# Patient Record
Sex: Female | Born: 1990 | Race: Black or African American | Marital: Married | Smoking: Never smoker
Health system: Southern US, Community
[De-identification: ages and names within clinical notes are randomized; demographics above are authoritative.]

## PROBLEM LIST (undated history)

## (undated) DIAGNOSIS — D249 Benign neoplasm of unspecified breast: Secondary | ICD-10-CM

## (undated) HISTORY — PX: BREAST SURGERY: SHX581

## (undated) HISTORY — DX: Benign neoplasm of unspecified breast: D24.9

---

## 2011-09-02 DIAGNOSIS — D249 Benign neoplasm of unspecified breast: Secondary | ICD-10-CM | POA: Insufficient documentation

## 2014-02-02 DIAGNOSIS — O1490 Unspecified pre-eclampsia, unspecified trimester: Secondary | ICD-10-CM

## 2014-02-02 HISTORY — DX: Unspecified pre-eclampsia, unspecified trimester: O14.90

## 2014-04-25 DIAGNOSIS — Z9289 Personal history of other medical treatment: Secondary | ICD-10-CM | POA: Insufficient documentation

## 2016-07-24 DIAGNOSIS — L700 Acne vulgaris: Secondary | ICD-10-CM | POA: Diagnosis not present

## 2016-08-31 DIAGNOSIS — N760 Acute vaginitis: Secondary | ICD-10-CM | POA: Diagnosis not present

## 2016-09-09 DIAGNOSIS — L700 Acne vulgaris: Secondary | ICD-10-CM | POA: Diagnosis not present

## 2016-09-18 DIAGNOSIS — R399 Unspecified symptoms and signs involving the genitourinary system: Secondary | ICD-10-CM | POA: Diagnosis not present

## 2016-09-18 DIAGNOSIS — N898 Other specified noninflammatory disorders of vagina: Secondary | ICD-10-CM | POA: Diagnosis not present

## 2016-09-18 DIAGNOSIS — R0789 Other chest pain: Secondary | ICD-10-CM | POA: Diagnosis not present

## 2016-10-05 DIAGNOSIS — R079 Chest pain, unspecified: Secondary | ICD-10-CM | POA: Diagnosis not present

## 2016-10-05 DIAGNOSIS — R51 Headache: Secondary | ICD-10-CM | POA: Diagnosis not present

## 2016-10-12 DIAGNOSIS — L700 Acne vulgaris: Secondary | ICD-10-CM | POA: Diagnosis not present

## 2016-10-12 DIAGNOSIS — L300 Nummular dermatitis: Secondary | ICD-10-CM | POA: Diagnosis not present

## 2016-10-12 DIAGNOSIS — L858 Other specified epidermal thickening: Secondary | ICD-10-CM | POA: Diagnosis not present

## 2016-11-27 DIAGNOSIS — R87610 Atypical squamous cells of undetermined significance on cytologic smear of cervix (ASC-US): Secondary | ICD-10-CM | POA: Diagnosis not present

## 2016-11-27 DIAGNOSIS — M778 Other enthesopathies, not elsewhere classified: Secondary | ICD-10-CM | POA: Diagnosis not present

## 2016-11-27 DIAGNOSIS — Z124 Encounter for screening for malignant neoplasm of cervix: Secondary | ICD-10-CM | POA: Diagnosis not present

## 2016-11-27 DIAGNOSIS — Z0000 Encounter for general adult medical examination without abnormal findings: Secondary | ICD-10-CM | POA: Diagnosis not present

## 2017-01-26 DIAGNOSIS — J069 Acute upper respiratory infection, unspecified: Secondary | ICD-10-CM | POA: Diagnosis not present

## 2017-01-26 DIAGNOSIS — R109 Unspecified abdominal pain: Secondary | ICD-10-CM | POA: Diagnosis not present

## 2017-01-26 DIAGNOSIS — R1031 Right lower quadrant pain: Secondary | ICD-10-CM | POA: Diagnosis not present

## 2017-03-22 DIAGNOSIS — G4489 Other headache syndrome: Secondary | ICD-10-CM | POA: Diagnosis not present

## 2017-03-30 ENCOUNTER — Ambulatory Visit: Payer: Self-pay | Admitting: Family Medicine

## 2017-03-30 VITALS — BP 128/83 | HR 97 | Temp 98.2°F | Wt 124.0 lb

## 2017-03-30 DIAGNOSIS — R059 Cough, unspecified: Secondary | ICD-10-CM

## 2017-03-30 DIAGNOSIS — R52 Pain, unspecified: Secondary | ICD-10-CM

## 2017-03-30 DIAGNOSIS — R69 Illness, unspecified: Secondary | ICD-10-CM

## 2017-03-30 DIAGNOSIS — R05 Cough: Secondary | ICD-10-CM

## 2017-03-30 DIAGNOSIS — J111 Influenza due to unidentified influenza virus with other respiratory manifestations: Secondary | ICD-10-CM

## 2017-03-30 LAB — POCT INFLUENZA A/B
INFLUENZA B, POC: NEGATIVE
Influenza A, POC: NEGATIVE

## 2017-03-30 NOTE — Progress Notes (Signed)
Laurie Rogers is a 27 y/o female who presents with 6 days of flu like symptoms. She reports positive exposure to her daughter and SO and has been using OTC treatment of tylenol cold and flu with minimal relief. Major symptoms are body aches, cough and sore throat. She denies a history of major chronic medical conditions or asthma.   Review of Systems  Constitutional: Positive for chills, fever and malaise/fatigue. Negative for diaphoresis and weight loss.  HENT: Positive for congestion and sore throat. Negative for ear discharge and sinus pain.   Eyes: Negative.   Respiratory: Positive for cough and sputum production. Negative for shortness of breath, wheezing and stridor.   Cardiovascular: Negative.   Gastrointestinal: Negative.   Genitourinary: Negative.   Musculoskeletal: Positive for myalgias.  Skin: Negative.   Neurological: Negative for weakness.    Physical Exam  Constitutional: Vital signs are normal. She appears well-developed and well-nourished.  HENT:  Head: Normocephalic.  Right Ear: Hearing, tympanic membrane, external ear and ear canal normal.  Left Ear: Hearing, tympanic membrane, external ear and ear canal normal.  Nose: Rhinorrhea present. No mucosal edema.  Mouth/Throat: Uvula is midline. Posterior oropharyngeal erythema present. No oropharyngeal exudate or posterior oropharyngeal edema.  Eyes: Pupils are equal, round, and reactive to light.  Neck: Normal range of motion. Neck supple.  Cardiovascular: Normal rate and regular rhythm.  Pulmonary/Chest: Effort normal and breath sounds normal. No apnea. No respiratory distress.  Abdominal: Soft. Bowel sounds are normal.  Musculoskeletal: Normal range of motion.  Neurological: She is alert.  Skin: Skin is warm and dry.    A: 1. Body aches   2. Influenza-like illness   3. Cough     P: 1. Body aches - POCT Influenza A/B- NEGATIVE  2. Influenza-like illness 48 hour work  Note Advised scheduled Motrin for pain and  fever for the next 48-72 hours.  3. Cough-  Symptomatic relief of vocal rest, throat lozenges, and increased oral hydration.  Discussed diagnosis, treatment and medications with patient who agrees with POC and verbalized understanding of information provided. No acute concerns on exam- pt NAD during visit.

## 2017-03-30 NOTE — Patient Instructions (Signed)

## 2017-04-01 ENCOUNTER — Telehealth: Payer: Self-pay | Admitting: Emergency Medicine

## 2017-04-01 NOTE — Telephone Encounter (Signed)
Left message. Follow up call on patient.

## 2017-05-06 ENCOUNTER — Encounter: Payer: Self-pay | Admitting: Family Medicine

## 2017-05-06 ENCOUNTER — Ambulatory Visit: Payer: Self-pay | Admitting: Family Medicine

## 2017-05-06 VITALS — BP 120/80 | HR 83 | Temp 98.1°F | Wt 120.0 lb

## 2017-05-06 DIAGNOSIS — R52 Pain, unspecified: Secondary | ICD-10-CM

## 2017-05-06 DIAGNOSIS — Z20828 Contact with and (suspected) exposure to other viral communicable diseases: Secondary | ICD-10-CM

## 2017-05-06 DIAGNOSIS — J111 Influenza due to unidentified influenza virus with other respiratory manifestations: Secondary | ICD-10-CM

## 2017-05-06 DIAGNOSIS — R69 Illness, unspecified: Principal | ICD-10-CM

## 2017-05-06 DIAGNOSIS — Z20818 Contact with and (suspected) exposure to other bacterial communicable diseases: Secondary | ICD-10-CM

## 2017-05-06 LAB — POCT INFLUENZA A/B
Influenza A, POC: NEGATIVE
Influenza B, POC: NEGATIVE

## 2017-05-06 NOTE — Patient Instructions (Addendum)
PLAN< 48 hours home rest and symptom management with OTC cough/cold medications F/U if fever develops and symptoms persist.  Influenza, Adult Influenza ("the flu") is an infection in the lungs, nose, and throat (respiratory tract). It is caused by a virus. The flu causes many common cold symptoms, as well as a high fever and body aches. It can make you feel very sick. The flu spreads easily from person to person (is contagious). Getting a flu shot (influenza vaccination) every year is the best way to prevent the flu. Follow these instructions at home:  Take over-the-counter and prescription medicines only as told by your doctor.  Use a cool mist humidifier to add moisture (humidity) to the air in your home. This can make it easier to breathe.  Rest as needed.  Drink enough fluid to keep your pee (urine) clear or pale yellow.  Cover your mouth and nose when you cough or sneeze.  Wash your hands with soap and water often, especially after you cough or sneeze. If you cannot use soap and water, use hand sanitizer.  Stay home from work or school as told by your doctor. Unless you are visiting your doctor, try to avoid leaving home until your fever has been gone for 24 hours without the use of medicine.  Keep all follow-up visits as told by your doctor. This is important. How is this prevented?  Getting a yearly (annual) flu shot is the best way to avoid getting the flu. You may get the flu shot in late summer, fall, or winter. Ask your doctor when you should get your flu shot.  Wash your hands often or use hand sanitizer often.  Avoid contact with people who are sick during cold and flu season.  Eat healthy foods.  Drink plenty of fluids.  Get enough sleep.  Exercise regularly. Contact a doctor if:  You get new symptoms.  You have: ? Chest pain. ? Watery poop (diarrhea). ? A fever.  Your cough gets worse.  You start to have more mucus.  You feel sick to your stomach  (nauseous).  You throw up (vomit). Get help right away if:  You start to be short of breath or have trouble breathing.  Your skin or nails turn a bluish color.  You have very bad pain or stiffness in your neck.  You get a sudden headache.  You get sudden pain in your face or ear.  You cannot stop throwing up. This information is not intended to replace advice given to you by your health care provider. Make sure you discuss any questions you have with your health care provider. Document Released: 10/29/2007 Document Revised: 06/27/2015 Document Reviewed: 11/13/2014 Elsevier Interactive Patient Education  2017 [employer].

## 2017-05-06 NOTE — Progress Notes (Signed)
Laurie Rogers is a 27 y.o. female who presents with 2 days of symptoms after patient reports exposure to household members with strep and flu. She reports having mild symptoms and concern for condition development.  Review of Systems  Constitutional: Positive for chills and malaise/fatigue. Negative for diaphoresis, fever and weight loss.  HENT: Negative.   Eyes: Negative.   Respiratory: Negative.   Cardiovascular: Negative.   Gastrointestinal: Negative.   Genitourinary: Negative.   Musculoskeletal: Negative.   Skin: Negative.   Neurological: Positive for headaches.  Endo/Heme/Allergies: Negative.   Psychiatric/Behavioral: Negative.      O: Vitals:   05/06/17 1247  BP: 120/80  Pulse: 83  Temp: 98.1 F (36.7 C)  SpO2: 99%   Physical Exam  Constitutional: She is oriented to person, place, and time. Vital signs are normal. She appears well-developed and well-nourished. She is active.  HENT:  Head: Normocephalic and atraumatic.  Right Ear: Hearing, tympanic membrane, external ear and ear canal normal.  Left Ear: Hearing, tympanic membrane, external ear and ear canal normal.  Nose: Rhinorrhea present. Right sinus exhibits frontal sinus tenderness. Left sinus exhibits frontal sinus tenderness.  Mouth/Throat: Uvula is midline and oropharynx is clear and moist.  Neck: Normal range of motion. Neck supple.  Cardiovascular: Normal rate and regular rhythm.  Pulmonary/Chest: Effort normal and breath sounds normal.  Abdominal: Soft. Bowel sounds are normal.  Musculoskeletal: Normal range of motion.  Lymphadenopathy:       Head (right side): No submental, no submandibular and no tonsillar adenopathy present.       Head (left side): No submental, no submandibular and no tonsillar adenopathy present.    She has no cervical adenopathy.  Neurological: She is alert and oriented to person, place, and time.  Skin: Skin is warm and dry.  Psychiatric: She has a normal mood and affect.   Vitals reviewed.   A: 1. Influenza-like illness   2. Strep throat exposure   3. Exposure to influenza   4. Body aches    P: PLAN< 48 hours home rest and symptom management with OTC cough/cold medications F/U if fever develops and symptoms persist. 1. Influenza-like illness  2. Strep throat exposure  3. Exposure to influenza  4. Body aches - POCT Influenza A/B Results for orders placed or performed in visit on 05/06/17 (from the past 24 hour(s))  POCT Influenza A/B     Status: Normal   Collection Time: 05/06/17  1:03 PM  Result Value Ref Range   Influenza A, POC Negative Negative   Influenza B, POC Negative Negative

## 2017-05-10 ENCOUNTER — Telehealth: Payer: Self-pay | Admitting: Emergency Medicine

## 2017-05-10 NOTE — Telephone Encounter (Signed)
Left message follow up call for [facility] visit

## 2017-06-08 DIAGNOSIS — S63501A Unspecified sprain of right wrist, initial encounter: Secondary | ICD-10-CM | POA: Diagnosis not present

## 2017-06-08 DIAGNOSIS — S5011XA Contusion of right forearm, initial encounter: Secondary | ICD-10-CM | POA: Diagnosis not present

## 2017-06-09 DIAGNOSIS — S52514A Nondisplaced fracture of right radial styloid process, initial encounter for closed fracture: Secondary | ICD-10-CM | POA: Diagnosis not present

## 2017-06-09 DIAGNOSIS — S53401A Unspecified sprain of right elbow, initial encounter: Secondary | ICD-10-CM | POA: Diagnosis not present

## 2017-06-17 DIAGNOSIS — S53401D Unspecified sprain of right elbow, subsequent encounter: Secondary | ICD-10-CM | POA: Diagnosis not present

## 2017-06-17 DIAGNOSIS — S62014D Nondisplaced fracture of distal pole of navicular [scaphoid] bone of right wrist, subsequent encounter for fracture with routine healing: Secondary | ICD-10-CM | POA: Diagnosis not present

## 2017-06-23 ENCOUNTER — Ambulatory Visit: Payer: 59 | Attending: Orthopaedic Surgery | Admitting: Occupational Therapy

## 2017-06-23 ENCOUNTER — Other Ambulatory Visit: Payer: Self-pay

## 2017-06-23 ENCOUNTER — Other Ambulatory Visit: Payer: Self-pay | Admitting: Orthopaedic Surgery

## 2017-06-23 ENCOUNTER — Encounter: Payer: Self-pay | Admitting: Occupational Therapy

## 2017-06-23 DIAGNOSIS — M25521 Pain in right elbow: Secondary | ICD-10-CM | POA: Diagnosis not present

## 2017-06-23 DIAGNOSIS — S62014D Nondisplaced fracture of distal pole of navicular [scaphoid] bone of right wrist, subsequent encounter for fracture with routine healing: Secondary | ICD-10-CM

## 2017-06-23 DIAGNOSIS — M25621 Stiffness of right elbow, not elsewhere classified: Secondary | ICD-10-CM | POA: Insufficient documentation

## 2017-06-23 NOTE — Patient Instructions (Signed)
Contrast 3-5 x day  And AROM for elbow about 3 x day  For elbow flexion and extention - when feeling pull back off   10 reps  Can do in supine if possible

## 2017-06-23 NOTE — Therapy (Signed)
[facility] [address], [place], [address] Phone: [phone]   Fax:  [phone]  Occupational Therapy Evaluation  Patient Details  Name: Laurie Rogers MRN: [id number] Date of Birth: [birth year] Referring Provider: Jackqulyn Livings   Encounter Date: 06/23/2017  OT End of Session - 06/23/17 1809    Visit Number  1    Number of Visits  4    Date for OT Re-Evaluation  07/21/17    OT Start Time  1210    OT Stop Time  1256    OT Time Calculation (min)  46 min    Activity Tolerance  Patient tolerated treatment well    Behavior During Therapy  [facility] for tasks assessed/performed       History reviewed. No pertinent past medical history.  History reviewed. No pertinent surgical history.  There were no vitals filed for this visit.  Subjective Assessment - 06/23/17 1228    Subjective   I fell on the 7th and then went to [facility] on 8th and got cast on my wrist -and then elbow splint on the 16th - and then CT this afternoon of the wrist , and MRI waiting for - that will be for my elbow     Patient Stated Goals  I just want to be able to use my arm like before - I am R handed - and has little 26 yrs old , do my hair, work , and workout in gym    Currently in Pain?  Yes    Pain Score  8     Pain Location  Elbow    Pain Orientation  Right    Pain Descriptors / Indicators  Aching;Tightness;Tender;Throbbing    Pain Type  Acute pain    Pain Onset  1 to 4 weeks ago    Pain Frequency  Constant        [facility] OT Assessment - 06/23/17 0001      Assessment   Medical Diagnosis  R elbow sprain - possible  ligament injury     Referring Provider  Jackqulyn Livings    Onset Date/Surgical Date  06/09/17    Hand Dominance  Right    Next MD Visit  -- after my CT scan      Precautions   Precaution Comments  [facility] protocol , no valgus stress     Required Braces or Orthoses  -- Elbow splint       Balance Screen   Has  the patient fallen in the past 6 months  Yes    How many times?  1    Has the patient had a decrease in activity level because of a fear of falling?   No    Is the patient reluctant to leave their home because of a fear of falling?   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 , workout in gym , has 79 yrs old daughter and  do own house worik       AROM   Right Elbow Flexion  95 after contrast 115    Right Elbow Extension  -45 after contrast -30       contrast done prior and after AROM to wrist   pt show increase ROM  And pain decrease  to 5/10 after contrast from 8/10    Contrast 3-5 x day  And AROM for elbow about 3 x day  For elbow flexion and extention - when feeling pull back off   10 reps  Can do in supine if possible               OT Education - 06/23/17 1809    Education provided  Yes    Education Details  findings of eval and HEP    Person(s) Educated  Patient    Methods  Explanation;Demonstration    Comprehension  Verbalized understanding;Returned demonstration       OT Short Term Goals - 06/23/17 1815      OT SHORT TERM GOAL #1   Title  Pain decrease on PREE by  more than 20 points     Baseline  pain on PREE for R elbow 41/50    Time  2    Period  Weeks    Status  New    Target Date  07/07/17        OT Long Term Goals - 06/23/17 1816      OT LONG TERM GOAL #1   Title  R elbow AROM improve with more than 30 degrees to use arm to eat , assist with bathing and eating     Baseline  R elbow flexion AROM 95 and extention -45     Time  4    Period  Weeks    Status  New    Target Date  07/21/17            Plan - 06/23/17 1810    Clinical Impression Statement  Pt present at OT eval about 2 wks out from fall - with scaphoid fx - pt in cast - and then elbow sprain with possible ligament injury - pt is waiting for MRI for elbow , and  is  schedule for CT this afternoon for her wrist - pt  in elbow splint - she report she hit her elbow on the armrest last night and has some increase discomfort at the elbow -  pt pain decrease with contrast when done and showed increase AROM - but had increase pain afterwards again - repeat conrast and pain decreased again - pt to do contrast and HEP at home and cont with elbow splint  - increase pain , edema anc decrease AROM and strength limiiting  her functional use of R  dominant hand in ADL's and IADL's     Occupational performance deficits (Please refer to evaluation for details):  ADL's;IADL's;Rest and Sleep;Work;Play;Leisure;Social Participation    Rehab Potential  Good    Current Impairments/barriers affecting progress:  await results of CT and MRI     OT Frequency  1x / week    OT Duration  4 weeks    OT Treatment/Interventions  Self-care/ADL training;Therapeutic exercise;Patient/family education;Splinting;Fluidtherapy;Contrast Bath;Manual Therapy;Passive range of motion    Plan  assess progress with AROM and if pain decrease - if MRI scheduled    Clinical Decision Making  Multiple treatment options, significant modification of task necessary    OT Home Exercise Plan  see pt instruction     Consulted and Agree with Plan of Care  Patient       Patient will benefit from skilled therapeutic intervention in order to improve the following deficits and impairments:  Impaired flexibility, Increased edema, Pain, Decreased range of motion, Decreased strength, Decreased knowledge of precautions, Impaired UE functional use  Visit Diagnosis: Pain in right elbow - Plan: Ot plan of care cert/re-cert  Stiffness of right elbow, not elsewhere classified - Plan: Ot plan of care cert/re-cert    Problem List There are no active problems to display for this patient.   Rosalyn Gess OTR/L,CLT 06/23/2017, 6:21 PM  [facility] [address] [place], [address] Phone: [phone]   Fax:  [phone]  Name: Aira Sallade MRN: [id number] Date of Birth: 08/10/1990

## 2017-06-24 DIAGNOSIS — B379 Candidiasis, unspecified: Secondary | ICD-10-CM | POA: Diagnosis not present

## 2017-06-24 DIAGNOSIS — R8290 Unspecified abnormal findings in urine: Secondary | ICD-10-CM | POA: Diagnosis not present

## 2017-06-24 DIAGNOSIS — N939 Abnormal uterine and vaginal bleeding, unspecified: Secondary | ICD-10-CM | POA: Diagnosis not present

## 2017-06-24 DIAGNOSIS — N760 Acute vaginitis: Secondary | ICD-10-CM | POA: Diagnosis not present

## 2017-06-28 DIAGNOSIS — S53401D Unspecified sprain of right elbow, subsequent encounter: Secondary | ICD-10-CM | POA: Diagnosis not present

## 2017-06-30 ENCOUNTER — Encounter: Payer: 59 | Admitting: Occupational Therapy

## 2017-07-01 DIAGNOSIS — S63511D Sprain of carpal joint of right wrist, subsequent encounter: Secondary | ICD-10-CM | POA: Diagnosis not present

## 2017-07-01 DIAGNOSIS — S5331XD Traumatic rupture of right ulnar collateral ligament, subsequent encounter: Secondary | ICD-10-CM | POA: Diagnosis not present

## 2017-07-01 DIAGNOSIS — S53441D Ulnar collateral ligament sprain of right elbow, subsequent encounter: Secondary | ICD-10-CM | POA: Diagnosis not present

## 2017-07-08 DIAGNOSIS — S53441D Ulnar collateral ligament sprain of right elbow, subsequent encounter: Secondary | ICD-10-CM | POA: Diagnosis not present

## 2017-07-13 DIAGNOSIS — L700 Acne vulgaris: Secondary | ICD-10-CM | POA: Diagnosis not present

## 2017-07-15 DIAGNOSIS — S63511D Sprain of carpal joint of right wrist, subsequent encounter: Secondary | ICD-10-CM | POA: Diagnosis not present

## 2017-07-15 DIAGNOSIS — S5331XD Traumatic rupture of right ulnar collateral ligament, subsequent encounter: Secondary | ICD-10-CM | POA: Diagnosis not present

## 2017-07-16 DIAGNOSIS — S53441D Ulnar collateral ligament sprain of right elbow, subsequent encounter: Secondary | ICD-10-CM | POA: Diagnosis not present

## 2017-07-22 DIAGNOSIS — S53441D Ulnar collateral ligament sprain of right elbow, subsequent encounter: Secondary | ICD-10-CM | POA: Diagnosis not present

## 2017-07-29 DIAGNOSIS — S53441D Ulnar collateral ligament sprain of right elbow, subsequent encounter: Secondary | ICD-10-CM | POA: Diagnosis not present

## 2017-08-23 DIAGNOSIS — N921 Excessive and frequent menstruation with irregular cycle: Secondary | ICD-10-CM | POA: Diagnosis not present

## 2017-08-23 DIAGNOSIS — Z30431 Encounter for routine checking of intrauterine contraceptive device: Secondary | ICD-10-CM | POA: Diagnosis not present

## 2017-08-23 DIAGNOSIS — B373 Candidiasis of vulva and vagina: Secondary | ICD-10-CM | POA: Diagnosis not present

## 2017-08-23 DIAGNOSIS — N898 Other specified noninflammatory disorders of vagina: Secondary | ICD-10-CM | POA: Diagnosis not present

## 2017-08-26 DIAGNOSIS — S53441D Ulnar collateral ligament sprain of right elbow, subsequent encounter: Secondary | ICD-10-CM | POA: Diagnosis not present

## 2017-08-31 DIAGNOSIS — S53441D Ulnar collateral ligament sprain of right elbow, subsequent encounter: Secondary | ICD-10-CM | POA: Diagnosis not present

## 2017-10-05 DIAGNOSIS — S53441D Ulnar collateral ligament sprain of right elbow, subsequent encounter: Secondary | ICD-10-CM | POA: Diagnosis not present

## 2017-12-17 DIAGNOSIS — D242 Benign neoplasm of left breast: Secondary | ICD-10-CM | POA: Diagnosis not present

## 2017-12-17 DIAGNOSIS — Z0000 Encounter for general adult medical examination without abnormal findings: Secondary | ICD-10-CM | POA: Diagnosis not present

## 2017-12-17 DIAGNOSIS — Z131 Encounter for screening for diabetes mellitus: Secondary | ICD-10-CM | POA: Diagnosis not present

## 2017-12-17 DIAGNOSIS — N644 Mastodynia: Secondary | ICD-10-CM | POA: Diagnosis not present

## 2018-03-04 DIAGNOSIS — N898 Other specified noninflammatory disorders of vagina: Secondary | ICD-10-CM | POA: Diagnosis not present

## 2018-03-04 DIAGNOSIS — N760 Acute vaginitis: Secondary | ICD-10-CM | POA: Diagnosis not present

## 2018-03-04 DIAGNOSIS — B9689 Other specified bacterial agents as the cause of diseases classified elsewhere: Secondary | ICD-10-CM | POA: Diagnosis not present

## 2018-03-21 ENCOUNTER — Encounter: Payer: Self-pay | Admitting: Physician Assistant

## 2018-03-21 ENCOUNTER — Ambulatory Visit: Payer: Self-pay | Admitting: Physician Assistant

## 2018-03-21 VITALS — BP 110/80 | HR 85 | Temp 98.5°F | Resp 16 | Ht 62.0 in | Wt 121.0 lb

## 2018-03-21 DIAGNOSIS — B9789 Other viral agents as the cause of diseases classified elsewhere: Secondary | ICD-10-CM | POA: Diagnosis not present

## 2018-03-21 DIAGNOSIS — R05 Cough: Secondary | ICD-10-CM

## 2018-03-21 DIAGNOSIS — J40 Bronchitis, not specified as acute or chronic: Secondary | ICD-10-CM | POA: Diagnosis not present

## 2018-03-21 DIAGNOSIS — R0602 Shortness of breath: Secondary | ICD-10-CM

## 2018-03-21 DIAGNOSIS — J069 Acute upper respiratory infection, unspecified: Secondary | ICD-10-CM | POA: Diagnosis not present

## 2018-03-21 DIAGNOSIS — R6889 Other general symptoms and signs: Secondary | ICD-10-CM

## 2018-03-21 DIAGNOSIS — R059 Cough, unspecified: Secondary | ICD-10-CM

## 2018-03-21 LAB — POCT INFLUENZA A/B
INFLUENZA A, POC: NEGATIVE
INFLUENZA B, POC: NEGATIVE

## 2018-03-21 NOTE — Patient Instructions (Addendum)
Shortness of Breath, Adult  Your flu test is negative. Due to your symptom set, I recommend you seek higher level of care at the urgent care or ED as you may need a chest xray or further lab work at this time.    Shortness of breath means you have trouble breathing. Shortness of breath could be a sign of a medical problem. Follow these instructions at home:   Watch for any changes in your symptoms.  Do not use any products that contain nicotine or tobacco, such as cigarettes, e-cigarettes, and chewing tobacco.  Do not smoke. Smoking can cause shortness of breath. If you need help to quit smoking, ask your doctor.  Avoid things that can make it harder to breathe, such as: ? Mold. ? Dust. ? Air pollution. ? Chemical smells. ? Things that can cause allergy symptoms (allergens), if you have allergies.  Keep your living space clean. Use products that help remove mold and dust.  Rest as needed. Slowly return to your normal activities.  Take over-the-counter and prescription medicines only as told by your doctor. This includes oxygen therapy and inhaled medicines.  Keep all follow-up visits as told by your doctor. This is important. Contact a doctor if:  Your condition does not get better as soon as expected.  You have a hard time doing your normal activities, even after you rest.  You have new symptoms. Get help right away if:  Your shortness of breath gets worse.  You have trouble breathing when you are resting.  You feel light-headed or you pass out (faint).  You have a cough that is not helped by medicines.  You cough up blood.  You have pain with breathing.  You have pain in your chest, arms, shoulders, or belly (abdomen).  You have a fever.  You cannot walk up stairs.  You cannot exercise the way you normally do. These symptoms may represent a serious problem that is an emergency. Do not wait to see if the symptoms will go away. Get medical help right away.  Call your local emergency services (911 in the [place] Do not drive yourself to the hospital. Summary  Shortness of breath is when you have trouble breathing enough air. It can be a sign of a medical problem.  Avoid things that make it hard for you to breathe, such as smoking, pollution, mold, and dust.  Watch for any changes in your symptoms. Contact your doctor if you do not get better or you get worse. This information is not intended to replace advice given to you by your health care provider. Make sure you discuss any questions you have with your health care provider. Document Released: 07/08/2007 Document Revised: 06/21/2017 Document Reviewed: 06/21/2017 Elsevier Interactive Patient Education  2019 [employer].

## 2018-03-21 NOTE — Progress Notes (Addendum)
MRN: [id number] DOB: [birth year]  Subjective:   Laurie Rogers is a 28 y.o. female presenting for chief complaint of Sore Throat (x4d); Nasal Congestion (x4d); Cough (x4d); and Headache (x4d) .  Reports 4 day history of worsening illness. Started out with scratchy throat. Then developed dry cough, chest congestion, nasal congestion, and headache. Now cough is productive and she is having shortness of breath sensation for the past few days. SOB is worse when she is coughing or if she is walking around. Did feel some wheezing a day ago, but that has since resolved.  Denies fever, chest pain, palpitations, lower leg swelling, hemoptysis,  sinus pain and inability to swallow, nausea, vomiting, abdominal pain and diarrhea. Has tried tylenol, sudafed, and delsym with no full relief. Has had sick exposure to coworkers, patients, and kids who had the flu. Denies hx of seasonal allergies, asthma, COPD, DM, heart disease, and HTN.  Patient has had flu shot this season. Denies smoking. No recent surgery/travel/immobilization, hx of cancer, prior DVT/PE. Uses mirena IUD. Denies any other aggravating or relieving factors, no other questions or concerns.  Review of Systems  Constitutional: Negative for diaphoresis.  Respiratory: Negative for stridor.   Cardiovascular: Negative for orthopnea.  Musculoskeletal: Negative for neck pain.  Skin: Negative for rash.  Neurological: Negative for dizziness.    Laurie Rogers has a current medication list which includes the following prescription(s): levonorgestrel. Also is allergic to oxycodone.  Laurie Rogers  has no past medical history on file. Also  has no past surgical history on file.   Objective:   Vitals: BP 110/80 (BP Location: Right Arm, Patient Position: Sitting, Cuff Size: Normal)   Pulse 85   Temp 98.5 F (36.9 C)   Resp 16   Ht 5\' 2"  (1.575 m)   Wt 121 lb (54.9 kg)   LMP 02/28/2018   SpO2 100%   BMI 22.13 kg/m   Physical Exam Vitals signs reviewed.   Constitutional:      General: She is not in acute distress.    Appearance: She is well-developed. She is not ill-appearing, toxic-appearing or diaphoretic.  HENT:     Head: Normocephalic and atraumatic.     Right Ear: Tympanic membrane, ear canal and external ear normal.     Left Ear: Tympanic membrane, ear canal and external ear normal.     Nose: Mucosal edema, congestion and rhinorrhea present. Rhinorrhea is clear.     Right Sinus: No maxillary sinus tenderness or frontal sinus tenderness.     Left Sinus: No maxillary sinus tenderness or frontal sinus tenderness.     Mouth/Throat:     Lips: Pink.     Mouth: Mucous membranes are moist.     Pharynx: Uvula midline. Posterior oropharyngeal erythema present. No oropharyngeal exudate or uvula swelling.     Tonsils: No tonsillar exudate or tonsillar abscesses. Swelling: 1+ on the right. 1+ on the left.  Eyes:     Conjunctiva/sclera: Conjunctivae normal.  Neck:     Musculoskeletal: Normal range of motion.  Cardiovascular:     Rate and Rhythm: Normal rate and regular rhythm.     Heart sounds: Normal heart sounds.  Pulmonary:     Effort: Pulmonary effort is normal. No tachypnea, accessory muscle usage or respiratory distress.     Breath sounds: Normal breath sounds. No stridor. No decreased breath sounds, wheezing, rhonchi or rales.  Lymphadenopathy:     Head:     Right side of head: No submental, submandibular, tonsillar, preauricular, posterior auricular or  occipital adenopathy.     Left side of head: No submental, submandibular, tonsillar, preauricular, posterior auricular or occipital adenopathy.     Cervical: No cervical adenopathy.     Upper Body:     Right upper body: No supraclavicular adenopathy.     Left upper body: No supraclavicular adenopathy.  Skin:    General: Skin is warm and dry.  Neurological:     Mental Status: She is alert.    Results for orders placed or performed in visit on 03/21/18 (from the past 24 hour(s))   POCT Influenza A/B     Status: Normal   Collection Time: 03/21/18 12:07 PM  Result Value Ref Range   Influenza A, POC Negative Negative   Influenza B, POC Negative Negative   Assessment and Plan :  1. Cough Pt is overall well appearing, NAD. VSS. Afebrile. HR 85bpm. No tachypnea. SpO2 100%. Lungs CTAB. POC flu test negative. DDx includes viral URI vs lower respiratory illness.   Discussed limitations of our office in regards to imaging/labs. Due to her symptoms of SOB and DOE with normal lung sounds on ausculatiton, would recommend higher level of care at this time for further evaluation and management. Patient verbalized understanding of information provided and agrees with plan of care (POC), all questions answered. She agrees to go immediately to urgent care or ED. Overall stable, can transport herself via personal vehicle.  2. Shortness of breath 3. Flu-like symptoms - POCT Influenza A/B   Tenna Delaine, PA-C  [facility] 03/21/2018 12:10 PM

## 2018-03-23 ENCOUNTER — Ambulatory Visit
Admission: RE | Admit: 2018-03-23 | Discharge: 2018-03-23 | Disposition: A | Discharge status: Home / Self Care | Payer: 59 | Source: Ambulatory Visit | Attending: Internal Medicine | Admitting: Internal Medicine

## 2018-03-23 ENCOUNTER — Ambulatory Visit (INDEPENDENT_AMBULATORY_CARE_PROVIDER_SITE_OTHER): Payer: 59 | Admitting: Internal Medicine

## 2018-03-23 ENCOUNTER — Telehealth: Payer: Self-pay | Admitting: Emergency Medicine

## 2018-03-23 ENCOUNTER — Other Ambulatory Visit: Payer: Self-pay

## 2018-03-23 ENCOUNTER — Encounter: Payer: Self-pay | Admitting: Internal Medicine

## 2018-03-23 VITALS — BP 112/80 | HR 88 | Resp 16 | Wt 121.0 lb

## 2018-03-23 DIAGNOSIS — J209 Acute bronchitis, unspecified: Secondary | ICD-10-CM | POA: Diagnosis not present

## 2018-03-23 DIAGNOSIS — R05 Cough: Secondary | ICD-10-CM

## 2018-03-23 DIAGNOSIS — R079 Chest pain, unspecified: Secondary | ICD-10-CM | POA: Diagnosis not present

## 2018-03-23 DIAGNOSIS — R0600 Dyspnea, unspecified: Secondary | ICD-10-CM

## 2018-03-23 DIAGNOSIS — R059 Cough, unspecified: Secondary | ICD-10-CM

## 2018-03-23 MED ORDER — ALBUTEROL SULFATE HFA 108 (90 BASE) MCG/ACT IN AERS: 1.0000 | INHALATION_SPRAY | RESPIRATORY_TRACT | 3 refills | Status: DC | PRN

## 2018-03-23 MED ORDER — AZITHROMYCIN 250 MG PO TABS: ORAL_TABLET | ORAL | 0 refills | Status: DC

## 2018-03-23 NOTE — Telephone Encounter (Signed)
Patient contacted me back to state that she did take provider advised and sought urgent care to have CXR done to r/o pneumonia which was neg. Patient was given antibiotic for bronchitis

## 2018-03-23 NOTE — Telephone Encounter (Signed)
Left message following up on patient  visit with [facility] and Did she seek further care with an Urgent care or E.R  per [facility] provider.

## 2018-03-23 NOTE — Progress Notes (Signed)
Name: Laurie Rogers MRN: [id number] DOB: 06-17-1990     CONSULTATION DATE: 2.19.2020 REFERRING MD : [facility]  CHIEF COMPLAINT: not feeling well  STUDIES:    2.19.2020 CXR independently reviewed by Me No effusions No pneumonia No edema   HISTORY OF PRESENT ILLNESS: 28 yo AAF female seen today for not feeling well She has been having cough +chest congestion, wheezing since last 1 week  She has some SOB and DOE but no overt distress  She does not feel like she is getting better Cough is worse   She has no fevers at this time  She is nonsmoker No other medical issues She is not in any distress    PAST MEDICAL HISTORY :  H/o URI  Prior to Admission medications   Medication Sig Start Date End Date Taking? Authorizing Provider  levonorgestrel (MIRENA, 52 MG,) 20 MCG/24HR IUD by Intrauterine route.    [provider]   Allergies  Allergen Reactions  . Oxycodone Hives, Other (See Comments) and Rash    Other reaction(s): Headache Hot flashes Hot flashes, night sweats, and mild rash (on arm)     FAMILY HISTORY:  family history is not on file. SOCIAL HISTORY:  reports that she has never smoked. She has never used smokeless tobacco.  REVIEW OF SYSTEMS:   Constitutional: Negative for fever, chills, weight loss, malaise/fatigue and diaphoresis.  HENT: Negative for hearing loss, ear pain, nosebleeds, congestion, sore throat, neck pain, tinnitus and ear discharge.   Eyes: Negative for blurred vision, double vision, photophobia, pain, discharge and redness.  Respiratory: + cough, -hemoptysis, -sputum production, +shortness of breath, +wheezing and stridor.   Cardiovascular: Negative for chest pain, palpitations, orthopnea, claudication, leg swelling and PND.  Gastrointestinal: Negative for heartburn, nausea, vomiting, abdominal pain, diarrhea, constipation, blood in stool and melena.  Genitourinary: Negative for dysuria, urgency, frequency, hematuria and  flank pain.  Musculoskeletal: Negative for myalgias, back pain, joint pain and falls.  Skin: Negative for itching and rash.  Neurological: Negative for dizziness, tingling, tremors, sensory change, speech change, focal weakness, seizures, loss of consciousness, weakness and headaches.  Endo/Heme/Allergies: Negative for environmental allergies and polydipsia. Does not bruise/bleed easily.  ALL OTHER ROS ARE NEGATIVE    BP 112/80 (BP Location: Left Arm, Cuff Size: Normal)   Pulse 88   Resp 16   Wt 121 lb (54.9 kg)   LMP 02/28/2018   SpO2 100%   BMI 22.13 kg/m    Physical Examination:   GENERAL:NAD, no fevers, chills, no weakness no fatigue HEAD: Normocephalic, atraumatic.  EYES: Pupils equal, round, reactive to light. Extraocular muscles intact. No scleral icterus.  MOUTH: Moist mucosal membrane.   EAR, NOSE, THROAT: Clear without exudates. No external lesions.  NECK: Supple. No thyromegaly. No nodules. No JVD.  PULMONARY:CTA B/L no wheezes, no crackles, no rhonchi CARDIOVASCULAR: S1 and S2. Regular rate and rhythm. No murmurs, rubs, or gallops. No edema.  GASTROINTESTINAL: Soft, nontender, nondistended. No masses. Positive bowel sounds.  MUSCULOSKELETAL: No swelling, clubbing, or edema. Range of motion full in all extremities.  NEUROLOGIC: Cranial nerves II through XII are intact. No gross focal neurological deficits.  SKIN: No ulceration, lesions, rashes, or cyanosis. Skin warm and dry. Turgor intact.  PSYCHIATRIC: Mood, affect within normal limits. The patient is awake, alert and oriented x 3. Insight, judgment intact.      ASSESSMENT / PLAN:  Acute Bronchitis with cough, wheezing c/w reactive airways disease Continue prednisone taper as prescribed Start Z pak Albuterol 2-4  puffs as needed Mucinex as needed   Patient  satisfied with Plan of action and management. All questions answered Follow up in 3 months   Carston Riedl Patricia Pesa, M.D.  [facility]  Medical Director [place] Director [facility] Cardio-Pulmonary Department

## 2018-03-23 NOTE — Addendum Note (Signed)
Addended by: Flora Lipps on: 03/23/2018 05:14 PM   Modules accepted: Orders

## 2018-03-23 NOTE — Patient Instructions (Signed)
Start ALBUTEROL 2-4 puffs every 4 hrs as needed for cough  MUCINEX 600 mg tiwce daily as needed for cough  Continue prednisone taper as prescribed

## 2018-04-08 DIAGNOSIS — N924 Excessive bleeding in the premenopausal period: Secondary | ICD-10-CM | POA: Diagnosis not present

## 2018-04-08 DIAGNOSIS — Z30011 Encounter for initial prescription of contraceptive pills: Secondary | ICD-10-CM | POA: Diagnosis not present

## 2018-04-08 DIAGNOSIS — Z30432 Encounter for removal of intrauterine contraceptive device: Secondary | ICD-10-CM | POA: Diagnosis not present

## 2018-07-26 DIAGNOSIS — N898 Other specified noninflammatory disorders of vagina: Secondary | ICD-10-CM | POA: Diagnosis not present

## 2018-07-26 DIAGNOSIS — N760 Acute vaginitis: Secondary | ICD-10-CM | POA: Diagnosis not present

## 2018-09-29 DIAGNOSIS — R5383 Other fatigue: Secondary | ICD-10-CM | POA: Diagnosis not present

## 2018-09-30 DIAGNOSIS — R5383 Other fatigue: Secondary | ICD-10-CM | POA: Diagnosis not present

## 2018-10-27 ENCOUNTER — Other Ambulatory Visit: Payer: Self-pay

## 2018-10-27 DIAGNOSIS — R6889 Other general symptoms and signs: Secondary | ICD-10-CM | POA: Diagnosis not present

## 2018-10-27 DIAGNOSIS — Z20822 Contact with and (suspected) exposure to covid-19: Secondary | ICD-10-CM

## 2018-10-28 LAB — NOVEL CORONAVIRUS, NAA: SARS-CoV-2, NAA: NOT DETECTED

## 2018-12-19 DIAGNOSIS — L709 Acne, unspecified: Secondary | ICD-10-CM | POA: Diagnosis not present

## 2018-12-19 DIAGNOSIS — Z0000 Encounter for general adult medical examination without abnormal findings: Secondary | ICD-10-CM | POA: Diagnosis not present

## 2018-12-19 DIAGNOSIS — Z3041 Encounter for surveillance of contraceptive pills: Secondary | ICD-10-CM | POA: Diagnosis not present

## 2018-12-19 DIAGNOSIS — L608 Other nail disorders: Secondary | ICD-10-CM | POA: Diagnosis not present

## 2018-12-28 DIAGNOSIS — R42 Dizziness and giddiness: Secondary | ICD-10-CM | POA: Diagnosis not present

## 2018-12-28 DIAGNOSIS — R110 Nausea: Secondary | ICD-10-CM | POA: Diagnosis not present

## 2018-12-28 DIAGNOSIS — N898 Other specified noninflammatory disorders of vagina: Secondary | ICD-10-CM | POA: Diagnosis not present

## 2019-02-15 DIAGNOSIS — L700 Acne vulgaris: Secondary | ICD-10-CM | POA: Diagnosis not present

## 2019-02-15 DIAGNOSIS — L603 Nail dystrophy: Secondary | ICD-10-CM | POA: Diagnosis not present

## 2019-03-30 DIAGNOSIS — Z3041 Encounter for surveillance of contraceptive pills: Secondary | ICD-10-CM | POA: Diagnosis not present

## 2019-04-15 DIAGNOSIS — N898 Other specified noninflammatory disorders of vagina: Secondary | ICD-10-CM | POA: Diagnosis not present

## 2019-05-15 ENCOUNTER — Other Ambulatory Visit: Payer: Self-pay | Admitting: Student

## 2019-05-15 DIAGNOSIS — R6881 Early satiety: Secondary | ICD-10-CM | POA: Diagnosis not present

## 2019-05-15 DIAGNOSIS — Z3041 Encounter for surveillance of contraceptive pills: Secondary | ICD-10-CM | POA: Diagnosis not present

## 2019-05-15 DIAGNOSIS — R630 Anorexia: Secondary | ICD-10-CM | POA: Diagnosis not present

## 2019-11-10 DIAGNOSIS — N926 Irregular menstruation, unspecified: Secondary | ICD-10-CM | POA: Diagnosis not present

## 2020-03-04 DIAGNOSIS — R110 Nausea: Secondary | ICD-10-CM | POA: Diagnosis not present

## 2020-03-04 DIAGNOSIS — Z124 Encounter for screening for malignant neoplasm of cervix: Secondary | ICD-10-CM | POA: Diagnosis not present

## 2020-03-04 DIAGNOSIS — N926 Irregular menstruation, unspecified: Secondary | ICD-10-CM | POA: Diagnosis not present

## 2020-03-04 DIAGNOSIS — Z862 Personal history of diseases of the blood and blood-forming organs and certain disorders involving the immune mechanism: Secondary | ICD-10-CM | POA: Diagnosis not present

## 2020-03-04 DIAGNOSIS — Z0000 Encounter for general adult medical examination without abnormal findings: Secondary | ICD-10-CM | POA: Diagnosis not present

## 2020-03-09 IMAGING — CR DG CHEST 2V
1 series · 2 of 2 positions shown · non-contrast
Comparison: None.

CLINICAL DATA: Chest pain

EXAM:
CHEST - 2 VIEW

[Series 1: dg chest 2 view · 0.14mm/px · 2 of 2 slices shown]
[im 1/2]
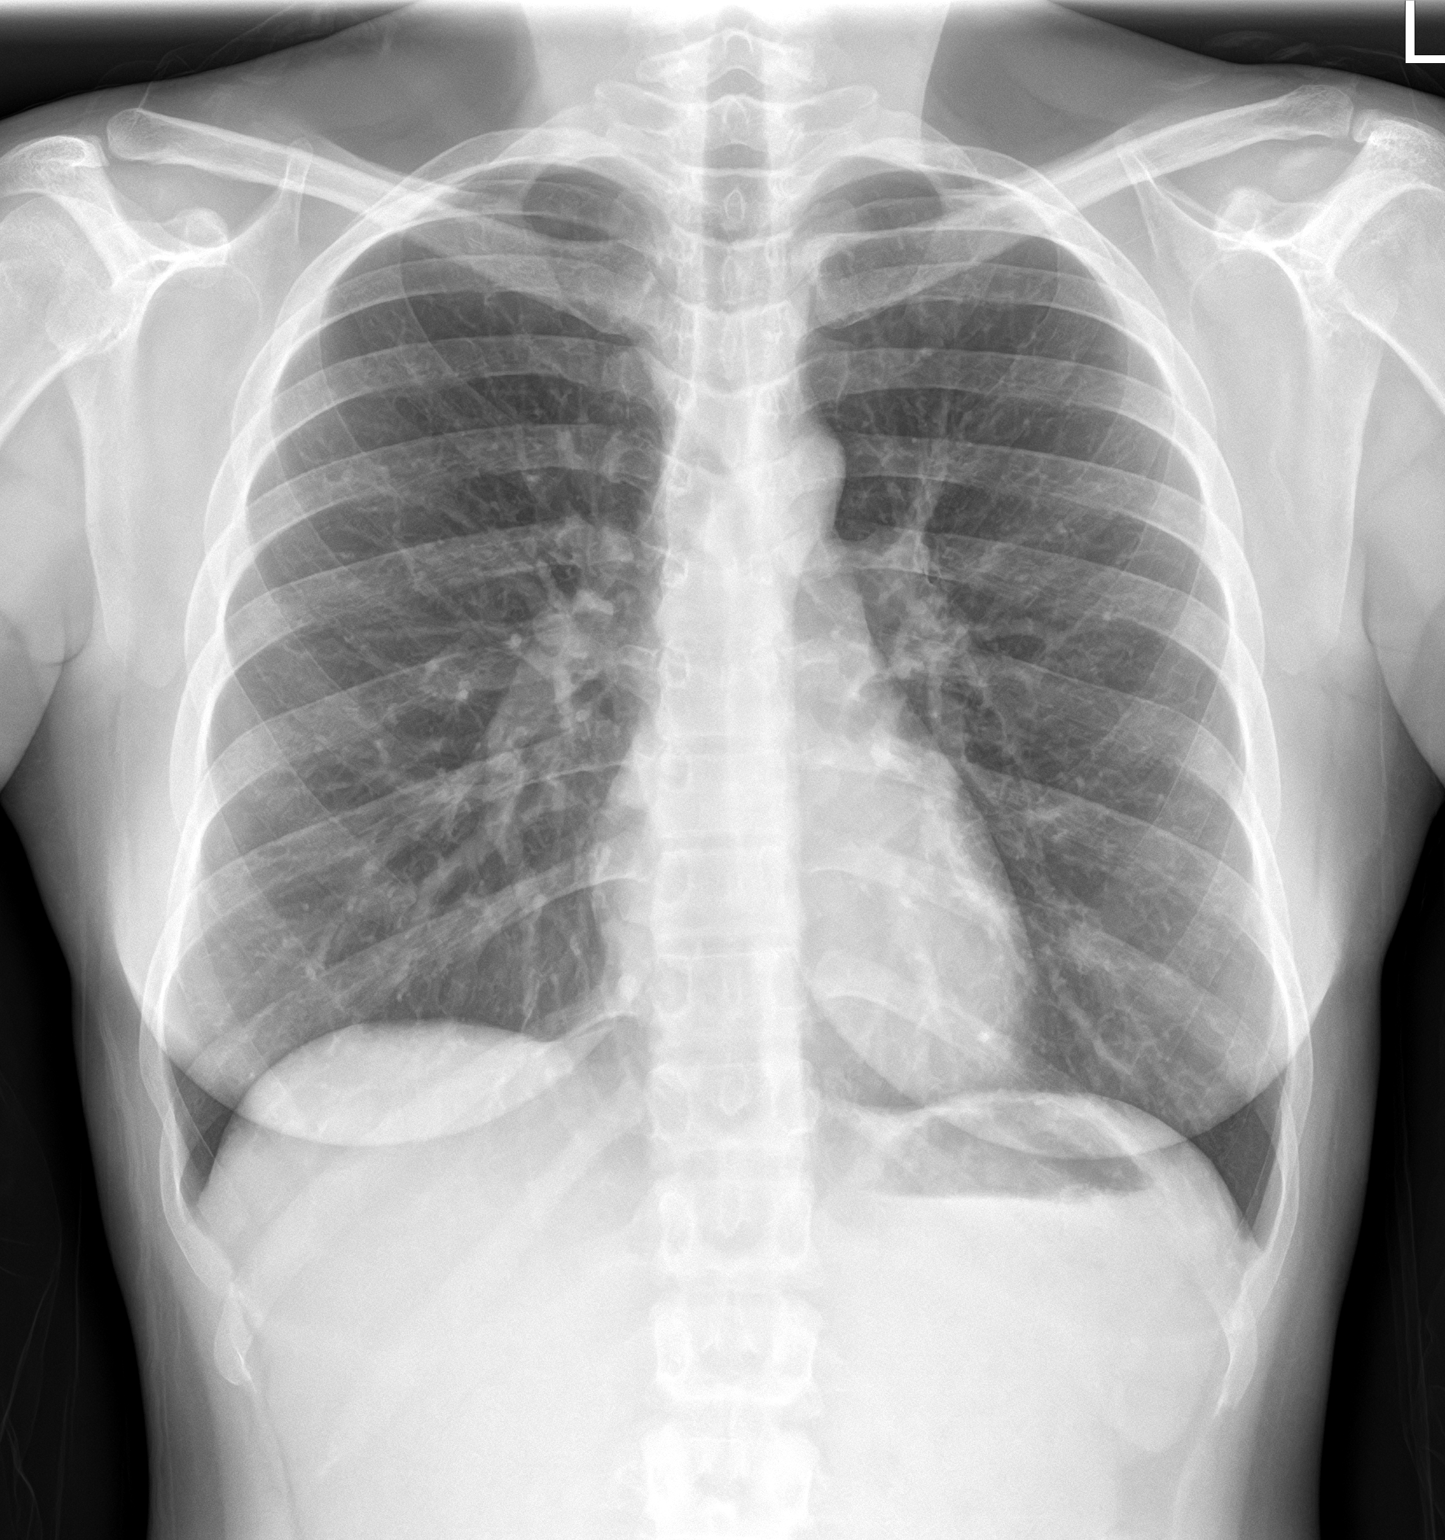
[im 2/2]
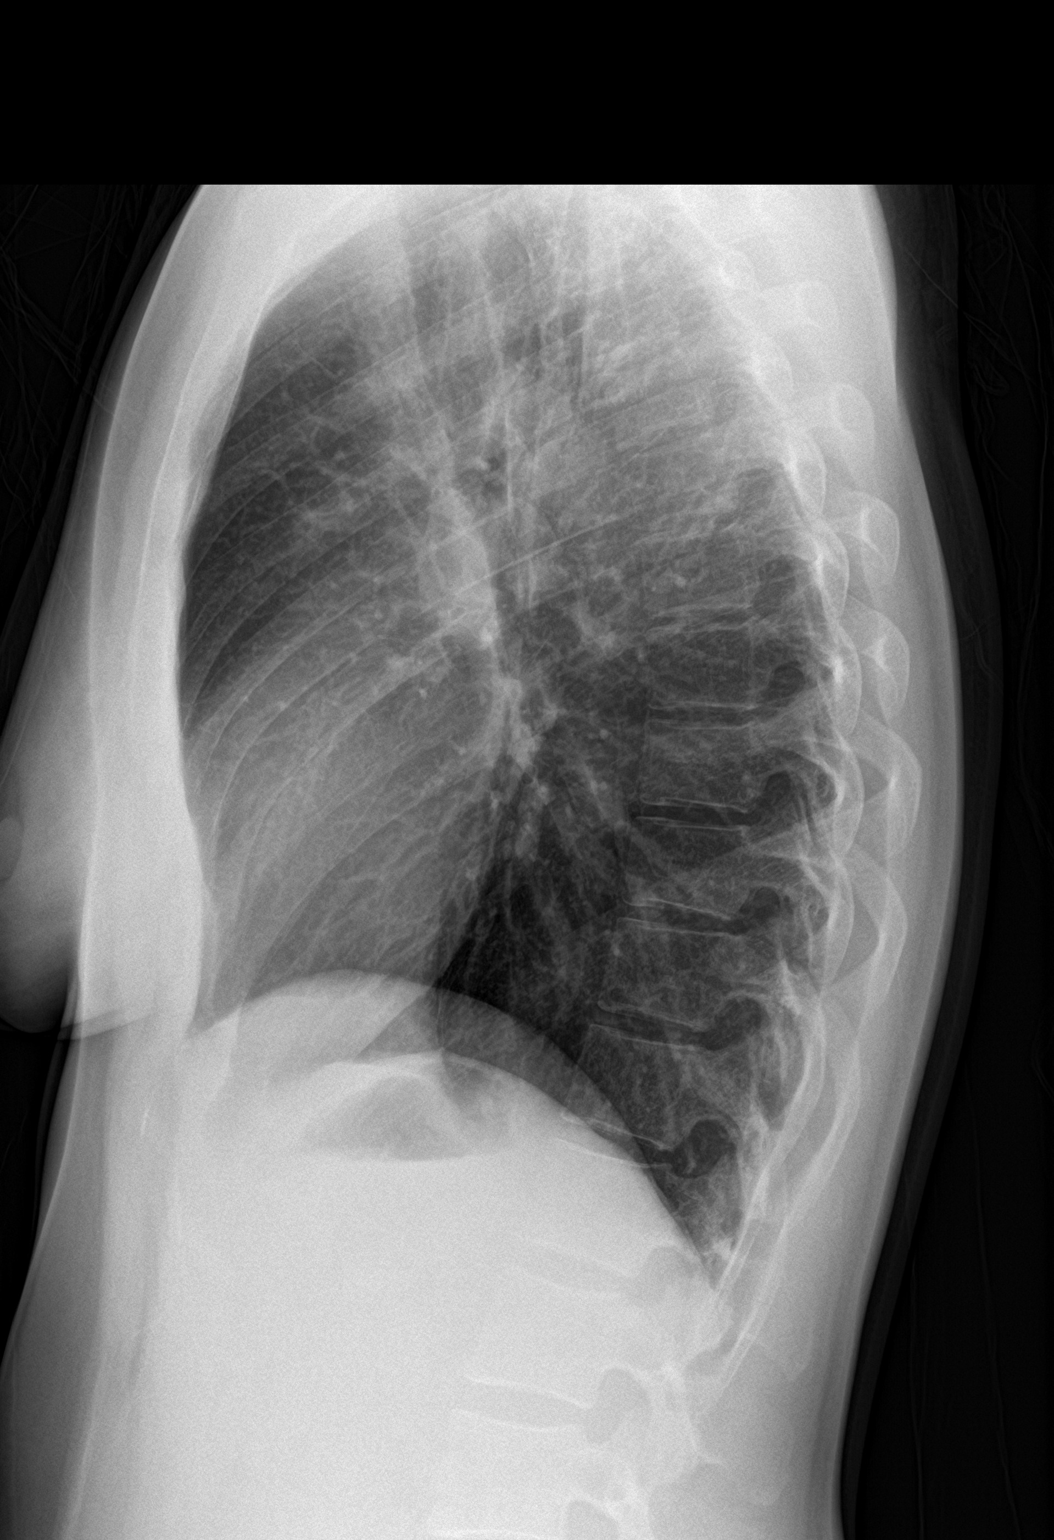

[2 of 2 positions shown; findings below may reference images not displayed]

FINDINGS: The heart size and mediastinal contours are within normal limits.
Both lungs are clear. The visualized skeletal structures are
unremarkable.
IMPRESSION: No active cardiopulmonary disease.

## 2020-04-26 ENCOUNTER — Other Ambulatory Visit: Payer: Self-pay | Admitting: Student

## 2020-07-23 ENCOUNTER — Other Ambulatory Visit: Payer: Self-pay

## 2020-07-23 MED FILL — Norethindrone Ace & Ethinyl Estradiol Tab 1 MG-20 MCG: ORAL | 84 days supply | Qty: 63 | Fill #0 | Status: AC

## 2020-08-23 ENCOUNTER — Other Ambulatory Visit: Payer: Self-pay

## 2020-08-23 DIAGNOSIS — A059 Bacterial foodborne intoxication, unspecified: Secondary | ICD-10-CM | POA: Diagnosis not present

## 2020-08-23 DIAGNOSIS — R197 Diarrhea, unspecified: Secondary | ICD-10-CM | POA: Diagnosis not present

## 2020-08-23 DIAGNOSIS — R110 Nausea: Secondary | ICD-10-CM | POA: Diagnosis not present

## 2020-08-23 MED ORDER — ONDANSETRON 8 MG PO TBDP
ORAL_TABLET | ORAL | 0 refills | Status: DC
  Filled 2020-08-23: qty 20, 7d supply, fill #0

## 2020-09-18 DIAGNOSIS — Z20828 Contact with and (suspected) exposure to other viral communicable diseases: Secondary | ICD-10-CM | POA: Diagnosis not present

## 2020-10-14 ENCOUNTER — Other Ambulatory Visit: Payer: Self-pay

## 2020-10-14 MED FILL — Norethindrone Ace & Ethinyl Estradiol Tab 1 MG-20 MCG: ORAL | 84 days supply | Qty: 63 | Fill #1 | Status: AC

## 2021-01-07 ENCOUNTER — Other Ambulatory Visit: Payer: Self-pay

## 2021-01-07 MED FILL — Norethindrone Ace & Ethinyl Estradiol Tab 1 MG-20 MCG: ORAL | 84 days supply | Qty: 63 | Fill #2 | Status: AC

## 2021-02-02 NOTE — L&D Delivery Note (Signed)
Delivery Note Pt opted for an epidural rather than IV meds.  She had SROM w/clear fluid shortly after receiving . An hour after that, she was noted to be C/C/+2 w/c/o pressure. After a two contraction 2nd stage, at 2:34 AM a viable female was delivered via Vaginal, Spontaneous (Presentation: Left Occiput Anterior:  body and nuchal cord, delivered through).  APGAR: 9, 9; weight pending.  After 1 minute, the cord was clamped and cut. 20 units of pitocin diluted in 500cc LR was infused rapidly IV, along w/1gm TXA d/t hx PPH. The placenta separated spontaneously and delivered via CCT and maternal pushing effort.  It was inspected and appears to be intact with a 3 VC.   Anesthesia: Epidural Episiotomy: None Lacerations: 1st degree Suture Repair: 2.0 vicryl Est. Blood Loss (mL):  292  Mom to postpartum.  Baby to Couplet care / Skin to Skin.  Laurie Rogers 11/18/2021, 3:02 AM

## 2021-03-14 DIAGNOSIS — Z0000 Encounter for general adult medical examination without abnormal findings: Secondary | ICD-10-CM | POA: Diagnosis not present

## 2021-03-14 DIAGNOSIS — Z13220 Encounter for screening for lipoid disorders: Secondary | ICD-10-CM | POA: Diagnosis not present

## 2021-03-14 DIAGNOSIS — N644 Mastodynia: Secondary | ICD-10-CM | POA: Diagnosis not present

## 2021-03-14 DIAGNOSIS — Z3490 Encounter for supervision of normal pregnancy, unspecified, unspecified trimester: Secondary | ICD-10-CM | POA: Diagnosis not present

## 2021-04-07 ENCOUNTER — Encounter: Payer: Self-pay | Admitting: Obstetrics and Gynecology

## 2021-04-17 ENCOUNTER — Telehealth (INDEPENDENT_AMBULATORY_CARE_PROVIDER_SITE_OTHER): Payer: 59 | Admitting: *Deleted

## 2021-04-17 ENCOUNTER — Other Ambulatory Visit: Payer: Self-pay

## 2021-04-17 VITALS — Ht 60.0 in

## 2021-04-17 DIAGNOSIS — O09299 Supervision of pregnancy with other poor reproductive or obstetric history, unspecified trimester: Secondary | ICD-10-CM

## 2021-04-17 DIAGNOSIS — O09899 Supervision of other high risk pregnancies, unspecified trimester: Secondary | ICD-10-CM

## 2021-04-17 DIAGNOSIS — O0990 Supervision of high risk pregnancy, unspecified, unspecified trimester: Secondary | ICD-10-CM | POA: Insufficient documentation

## 2021-04-17 DIAGNOSIS — Z3A00 Weeks of gestation of pregnancy not specified: Secondary | ICD-10-CM

## 2021-04-17 DIAGNOSIS — D249 Benign neoplasm of unspecified breast: Secondary | ICD-10-CM | POA: Insufficient documentation

## 2021-04-17 HISTORY — DX: Supervision of pregnancy with other poor reproductive or obstetric history, unspecified trimester: O09.299

## 2021-04-17 MED ORDER — BLOOD PRESSURE KIT DEVI
1.0000 | 0 refills | Status: DC | PRN
  Filled 2021-04-17: qty 1, 30d supply, fill #0

## 2021-04-17 NOTE — Progress Notes (Signed)
New OB Intake ? ?I connected with  Laurie Rogers on 04/17/21 at 1:20  by MyChart Video Visit and verified that I am speaking with the correct person using two identifiers. Nurse is located at [facility] and pt is located at home. ? ?I discussed the limitations, risks, security and privacy concerns of performing an evaluation and management service by telephone and the availability of in person appointments. I also discussed with the patient that there may be a patient responsible charge related to this service. The patient expressed understanding and agreed to proceed. ? ?I explained I am completing New OB Intake today. We discussed her EDD of 10/22/21 that is based on LMP of 01/15/22. We discussed she had been on birth control pills and had missed doses  from 01/30/21 to 1/3 23. She did take pill 02/05/21. She did spot only with she wiped on 02/04/21. She requested ultrasound and we discussed we will order to verify dating. Dating [place] ordered for 04/28/21 at 10:00. Pt is G3/P1011. I reviewed her allergies, medications, Medical/Surgical/OB history, and appropriate screenings. I informed her of [facility] services. Based on history, this is a pregnancy  complicated by history of pre-eclampsia.  ?Patient did c/o having headaches lately , some strong. States she has not taken anything for them. She denies allergy symptoms. Instructed may take tylenol per package instructions. She also reports she has been taking blood pressure at home and it has been 131/79 at the highest. Offered nurse visit for bp check since new ob is a few weeks away. She declined. Advised if tylenol does not relieve tylenol she should get blood pressure at her PCP , here, or provider of her choice as I am concerned she had HTN.  ? ?Patient Active Problem List  ? Diagnosis Date Noted  ? Supervision of high risk pregnancy, antepartum 04/17/2021  ? History of pre-eclampsia in prior pregnancy, currently pregnant 04/17/2021  ? Fibroadenoma   ? ? ?Concerns  addressed today ? ?Delivery Plans:  ?Plans to deliver at [facility].  ? ?MyChart/Babyscripts ?MyChart access verified. I explained pt will have some visits in office and some virtually. Babyscripts instructions given and order placed.  ? ?Blood Pressure Cuff  ?Patient is Furniture conservator/restorer. Ordered blood pressure cuff from outpatient pharmacy.  Explained after first prenatal appt pt will check weekly and document in 46. ? ?Weight scale: Patient does not  have weight scale. But will buy one.   ? ?Anatomy US ?Explained Anatomy US will be scheduled at new ob once her dating [place] is reviewed and EDD confirmed.   ? ?Labs ?Discussed Johnsie Cancel genetic screening with patient. Would like both Panorama and Horizon drawn at new OB visit.Also informed  patient she will need AFP 15-21 weeks to complete genetic testing .Routine prenatal labs needed. ? ?Covid Vaccine ?Patient has covid vaccine.  ? ?Is patient a CenteringPregnancy candidate? Declined  ?  ?Is patient a Mom+Baby Combined Care candidate? Not a candidate   ? ?Is patient interested in [place]? Yes  placed  "Interested in WB - Schedule next visit with CNM" on sticky note ? ?Informed patient of [facility] website  and placed link in her AVS.  ? ?Social Determinants of Health ?Food Insecurity: Patient denies food insecurity. ?WIC Referral: Patient is not interested in referral to [facility].  ?Transportation: Patient denies transportation needs. ?Childcare: Discussed no children allowed at ultrasound appointments. Offered childcare services; patient declines childcare services at this time. ? ?Send link to Pregnancy Navigators ? ? ?Placed  OB Box on problem list and updated ? ?First visit review ?I reviewed new OB appt with pt. I explained she will have a pelvic exam, ob bloodwork with genetic screening, and PAP smear. Explained pt will be seen by Dr. Higinio Plan at first visit; encounter routed to appropriate provider. Explained that patient will be seen by pregnancy navigator  following visit with provider. [facility] information placed in AVS.  ? Staci Acosta ?04/17/2021  2:35 PM  ?

## 2021-04-17 NOTE — Patient Instructions (Addendum)
?  At our [facility] OB[facility] Practices, we work as an integrated team, providing care to address both physical and emotional health. Your medical provider may refer you to see our [facility]) on the same day you see your medical provider, as availability permits; often scheduled virtually at your convenience.  ?Our [facility] is available to all patients, visits generally last between 20-30 minutes, but can be longer or shorter, depending on patient need. The [facility] offers help with stress management, coping with symptoms of depression and anxiety, major life changes , sleep issues, changing risky behavior, grief and loss, life stress, working on personal life goals, and  behavioral health issues, as these all affect your overall health and wellness.  ?The [facility] is NOT available for the following: FMLA paperwork, court-ordered evaluations, specialty assessments (custody or disability), letters to employers, or obtaining certification for an emotional support animal. The [facility] does not provide long-term therapy. ?You have the right to refuse integrated behavioral health services, or to reschedule to see the [facility] at a later date.  ?Confidentiality exception: If it is suspected that a child or disabled adult is being abused or neglected, we are required by law to report that to either Child Protective Services or Adult Scientist, forensic.  ?If you have a diagnosis of Bipolar affective disorder, Schizophrenia, or recurrent Major depressive disorder, we will recommend that you establish care with a psychiatrist, as these are lifelong, chronic conditions, and we want your overall emotional health and medications to be more closely monitored. If you anticipate needing extended maternity leave due to mental health issues postpartum, it it recommended you inform your medical provider, so we can put in a referral to a psychiatrist as soon as possible. The [facility] is unable to recommend an extended maternity leave for mental  health issues. ?Your medical provider or [facility] may refer you to a therapist for ongoing, traditional therapy, or to a psychiatrist, for medication management, if it would benefit your overall health. ?Depending on your insurance, you may have a copay or be charged a deductible, depending on your insurance, to see the [facility]. If you are uninsured, it is recommended that you apply for financial assistance. (Forms may be requested at the front desk for in-person visits, via MyChart, or request a form during a virtual visit).  ?If you see the [facility] more than 6 times, you will have to complete a comprehensive clinical assessment interview with the [facility] to resume integrated services.  ?For virtual visits with the [facility], you must be physically in the state of [place] at the time of the visit. For example, if you live in [place], you will have to do an in-person visit with the [facility], and your out-of-state insurance may not cover behavioral health services in [place]. If you are going out of the state or country for any reason, the [facility] may see you virtually when you return to [place], but not while you are physically outside of [place].  ?  ? ?[email or web address]     panorama, horizon ? ?Here is a link to the Pregnancy Navigators . Please ?Fill out prior to your New OB appointment.  ? ?English Link: [email or web address] ? ?Spanish Link: [email or web address]  ? ?[email or web address] is a website with information to help you prepare for Labor and delivery, patient information, visitor information and more.   ?

## 2021-04-23 ENCOUNTER — Telehealth: Payer: 59

## 2021-04-23 ENCOUNTER — Inpatient Hospital Stay (HOSPITAL_COMMUNITY)
Admission: AD | Admit: 2021-04-23 | Discharge: 2021-04-24 | Disposition: A | Discharge status: Home / Self Care | Payer: 59 | Attending: Obstetrics and Gynecology | Admitting: Obstetrics and Gynecology

## 2021-04-23 ENCOUNTER — Encounter (HOSPITAL_COMMUNITY): Payer: Self-pay | Admitting: Obstetrics and Gynecology

## 2021-04-23 ENCOUNTER — Other Ambulatory Visit: Payer: Self-pay

## 2021-04-23 DIAGNOSIS — O26891 Other specified pregnancy related conditions, first trimester: Secondary | ICD-10-CM | POA: Diagnosis not present

## 2021-04-23 DIAGNOSIS — Z885 Allergy status to narcotic agent status: Secondary | ICD-10-CM | POA: Insufficient documentation

## 2021-04-23 DIAGNOSIS — O26892 Other specified pregnancy related conditions, second trimester: Secondary | ICD-10-CM | POA: Diagnosis not present

## 2021-04-23 DIAGNOSIS — K5900 Constipation, unspecified: Secondary | ICD-10-CM | POA: Diagnosis not present

## 2021-04-23 DIAGNOSIS — Z3A14 14 weeks gestation of pregnancy: Secondary | ICD-10-CM | POA: Diagnosis not present

## 2021-04-23 DIAGNOSIS — O99612 Diseases of the digestive system complicating pregnancy, second trimester: Secondary | ICD-10-CM | POA: Insufficient documentation

## 2021-04-23 DIAGNOSIS — R519 Headache, unspecified: Secondary | ICD-10-CM | POA: Insufficient documentation

## 2021-04-23 LAB — URINALYSIS, ROUTINE W REFLEX MICROSCOPIC
Bilirubin Urine: NEGATIVE
Glucose, UA: NEGATIVE mg/dL
Hgb urine dipstick: NEGATIVE
Ketones, ur: NEGATIVE mg/dL
Leukocytes,Ua: NEGATIVE
Nitrite: NEGATIVE
Protein, ur: NEGATIVE mg/dL
Specific Gravity, Urine: 1.009 (ref 1.005–1.030)
pH: 7 (ref 5.0–8.0)

## 2021-04-23 LAB — COMPREHENSIVE METABOLIC PANEL
ALT: 21 U/L (ref 0–44)
AST: 17 U/L (ref 15–41)
Albumin: 3.4 g/dL — ABNORMAL LOW (ref 3.5–5.0)
Alkaline Phosphatase: 42 U/L (ref 38–126)
Anion gap: 8 (ref 5–15)
BUN: 10 mg/dL (ref 6–20)
CO2: 22 mmol/L (ref 22–32)
Calcium: 8.9 mg/dL (ref 8.9–10.3)
Chloride: 104 mmol/L (ref 98–111)
Creatinine, Ser: 0.62 mg/dL (ref 0.44–1.00)
GFR, Estimated: >60 mL/min (ref >60)
Glucose, Bld: 102 mg/dL — ABNORMAL HIGH (ref 70–99)
Potassium: 3.5 mmol/L (ref 3.5–5.1)
Sodium: 134 mmol/L — ABNORMAL LOW (ref 135–145)
Total Bilirubin: 0.3 mg/dL (ref 0.3–1.2)
Total Protein: 6.2 g/dL — ABNORMAL LOW (ref 6.5–8.1)

## 2021-04-23 LAB — CBC
HCT: 32.9 % — ABNORMAL LOW (ref 36.0–46.0)
Hemoglobin: 11.2 g/dL — ABNORMAL LOW (ref 12.0–15.0)
MCH: 30.7 pg (ref 26.0–34.0)
MCHC: 34 g/dL (ref 30.0–36.0)
MCV: 90.1 fL (ref 80.0–100.0)
Platelets: 222 10*3/uL (ref 150–400)
RBC: 3.65 MIL/uL — ABNORMAL LOW (ref 3.87–5.11)
RDW: 12.7 % (ref 11.5–15.5)
WBC: 7.4 10*3/uL (ref 4.0–10.5)
nRBC: 0 % (ref 0.0–0.2)

## 2021-04-23 NOTE — MAU Provider Note (Signed)
Chief Complaint: Headache ? ? Event Date/Time  ? First Provider Initiated Contact with Patient 04/23/21 2234   ?  ? ?SUBJECTIVE ?HPI: Laurie Rogers is a 31 y.o. G3P1011 at 6w0dby LMP who presents to maternity admissions reporting headache and low blood pressure. She reports headache off and on x 5 days, with some intermittent dizziness with standing.  She has a home BP cuff and BPs at home were 90s/60s and no higher than low 100s/70s.  She is drinking plenty of water and denies any vomiting. She does report constipation. She prefers not to take medication during pregnancy.  She was a coffee drinker before pregnancy and stopped it completely.  There are no other associated symptoms.   ? ? ?HPI ? ?Past Medical History:  ?Diagnosis Date  ? Fibroadenoma   ? Pre-eclampsia 2016  ? ?Past Surgical History:  ?Procedure Laterality Date  ? BREAST SURGERY    ? fibroadenoma removed  ? ?Social History  ? ?Socioeconomic History  ? Marital status: Married  ?  Spouse name: Not on file  ? Number of children: Not on file  ? Years of education: Not on file  ? Highest education level: Not on file  ?Occupational History  ? Not on file  ?Tobacco Use  ? Smoking status: Never  ? Smokeless tobacco: Never  ?Vaping Use  ? Vaping Use: Never used  ?Substance and Sexual Activity  ? Alcohol use: Not Currently  ?  Comment: occasionally  ? Drug use: Never  ? Sexual activity: Yes  ?  Birth control/protection: Pill  ?Other Topics Concern  ? Not on file  ?Social History Narrative  ? Not on file  ? ?Social Determinants of Health  ? ?Financial Resource Strain: Not on file  ?Food Insecurity: No Food Insecurity  ? Worried About RCharity fundraiserin the Last Year: Never true  ? Ran Out of Food in the Last Year: Never true  ?Transportation Needs: No Transportation Needs  ? Lack of Transportation (Medical): No  ? Lack of Transportation (Non-Medical): No  ?Physical Activity: Not on file  ?Stress: Not on file  ?Social Connections: Not on file   ?Intimate Partner Violence: Not on file  ? ?No current facility-administered medications on file prior to encounter.  ? ?Current Outpatient Medications on File Prior to Encounter  ?Medication Sig Dispense Refill  ? albuterol (VENTOLIN HFA) 108 (90 Base) MCG/ACT inhaler Inhale 1-2 puffs into the lungs every 4 (four) hours as needed for wheezing or shortness of breath. 1 Inhaler 3  ? Blood Pressure Monitoring (BLOOD PRESSURE KIT) DEVI 1 Device by Does not apply route as needed. 1 each 0  ? fluticasone (FLONASE) 50 MCG/ACT nasal spray Place 2 sprays into the nose daily.    ? ondansetron (ZOFRAN-ODT) 8 MG disintegrating tablet Take 1 tablet (8 mg total) by mouth every 8 (eight) hours as needed for Nausea for up to 7 days 20 tablet 0  ? Prenatal Vit-Fe Fumarate-FA (PRENATAL VITAMIN PO) Take 1 tablet by mouth daily.    ? ?Allergies  ?Allergen Reactions  ? Oxycodone Hives, Other (See Comments) and Rash  ?  Other reaction(s): Headache ?Hot flashes ?Hot flashes, night sweats, and mild rash (on arm) ?  ? ? ?ROS:  ?Review of Systems  ?Constitutional:  Negative for chills, fatigue and fever.  ?Respiratory:  Negative for shortness of breath.   ?Cardiovascular:  Negative for chest pain.  ?Gastrointestinal:  Negative for nausea and vomiting.  ?Genitourinary:  Negative for  difficulty urinating, dysuria, flank pain, pelvic pain, vaginal bleeding, vaginal discharge and vaginal pain.  ?Neurological:  Positive for dizziness and headaches.  ?Psychiatric/Behavioral: Negative.    ? ? ?I have reviewed patient's Past Medical Hx, Surgical Hx, Family Hx, Social Hx, medications and allergies.  ? ?Physical Exam  ?Patient Vitals for the past 24 hrs: ? BP Temp Pulse Resp SpO2  ?04/24/21 0001 118/76 -- 73 -- --  ?04/23/21 2215 120/71 -- 86 -- 100 %  ?04/23/21 2213 -- 98.2 ?F (36.8 ?C) -- 16 100 %  ? ?Constitutional: Well-developed, well-nourished female in no acute distress.  ?Cardiovascular: normal rate ?Respiratory: normal effort ?GI: Abd soft,  non-tender. Pos BS x 4 ?MS: Extremities nontender, no edema, normal ROM ?Neurologic: Alert and oriented x 4.  ?GU: Neg CVAT. ? ?PELVIC EXAM: Deferred ? ?FHT 169 by doppler ? ?LAB RESULTS ?Results for orders placed or performed during the hospital encounter of 04/23/21 (from the past 24 hour(s))  ?Urinalysis, Routine w reflex microscopic Urine, Clean Catch     Status: Abnormal  ? Collection Time: 04/23/21 10:15 PM  ?Result Value Ref Range  ? Color, Urine STRAW (A) YELLOW  ? APPearance CLEAR CLEAR  ? Specific Gravity, Urine 1.009 1.005 - 1.030  ? pH 7.0 5.0 - 8.0  ? Glucose, UA NEGATIVE NEGATIVE mg/dL  ? Hgb urine dipstick NEGATIVE NEGATIVE  ? Bilirubin Urine NEGATIVE NEGATIVE  ? Ketones, ur NEGATIVE NEGATIVE mg/dL  ? Protein, ur NEGATIVE NEGATIVE mg/dL  ? Nitrite NEGATIVE NEGATIVE  ? Leukocytes,Ua NEGATIVE NEGATIVE  ?CBC     Status: Abnormal  ? Collection Time: 04/23/21 11:02 PM  ?Result Value Ref Range  ? WBC 7.4 4.0 - 10.5 K/uL  ? RBC 3.65 (L) 3.87 - 5.11 MIL/uL  ? Hemoglobin 11.2 (L) 12.0 - 15.0 g/dL  ? HCT 32.9 (L) 36.0 - 46.0 %  ? MCV 90.1 80.0 - 100.0 fL  ? MCH 30.7 26.0 - 34.0 pg  ? MCHC 34.0 30.0 - 36.0 g/dL  ? RDW 12.7 11.5 - 15.5 %  ? Platelets 222 150 - 400 K/uL  ? nRBC 0.0 0.0 - 0.2 %  ?Comprehensive metabolic panel     Status: Abnormal  ? Collection Time: 04/23/21 11:02 PM  ?Result Value Ref Range  ? Sodium 134 (L) 135 - 145 mmol/L  ? Potassium 3.5 3.5 - 5.1 mmol/L  ? Chloride 104 98 - 111 mmol/L  ? CO2 22 22 - 32 mmol/L  ? Glucose, Bld 102 (H) 70 - 99 mg/dL  ? BUN 10 6 - 20 mg/dL  ? Creatinine, Ser 0.62 0.44 - 1.00 mg/dL  ? Calcium 8.9 8.9 - 10.3 mg/dL  ? Total Protein 6.2 (L) 6.5 - 8.1 g/dL  ? Albumin 3.4 (L) 3.5 - 5.0 g/dL  ? AST 17 15 - 41 U/L  ? ALT 21 0 - 44 U/L  ? Alkaline Phosphatase 42 38 - 126 U/L  ? Total Bilirubin 0.3 0.3 - 1.2 mg/dL  ? GFR, Estimated >60 >60 mL/min  ? Anion gap 8 5 - 15  ? ? ?  ? ?IMAGING ?No results found. ? ?MAU Management/MDM: ?Orders Placed This Encounter  ?Procedures  ?  Urinalysis, Routine w reflex microscopic Urine, Clean Catch  ? CBC  ? Comprehensive metabolic panel  ? Discharge patient  ?  ?No orders of the defined types were placed in this encounter. ?  ?UA wnl, pt well hydrated.  She declines medications, including PO Tylenol for h/a.   ?Pt  with normal CBC, CMP. She is taking gummy PNV.  Add iron tablet every other day or every 3 days if she has constipation.  Discussed diet changes for constipation and use of Colace and/or Miralax PRN.  Reassurance provided  ?Precautions/reasons to return given. ? ?ASSESSMENT ?1. Headache in pregnancy, antepartum, first trimester   ?2. [redacted] weeks gestation of pregnancy   ?3. Constipation during pregnancy in first trimester   ? ? ?PLAN ?Discharge home ?Allergies as of 04/24/2021   ? ?   Reactions  ? Oxycodone Hives, Other (See Comments), Rash  ? Other reaction(s): Headache ?Hot flashes ?Hot flashes, night sweats, and mild rash (on arm)  ? ?  ? ?  ?Medication List  ?  ? ?STOP taking these medications   ? ?azithromycin 250 MG tablet ?Commonly known as: Zithromax Z-Pak ?  ?levonorgestrel 20 MCG/24HR IUD ?Commonly known as: MIRENA ?  ?norethindrone-ethinyl estradiol 1-20 MG-MCG tablet ?Commonly known as: LOESTRIN ?  ? ?  ? ?TAKE these medications   ? ?albuterol 108 (90 Base) MCG/ACT inhaler ?Commonly known as: Ventolin HFA ?Inhale 1-2 puffs into the lungs every 4 (four) hours as needed for wheezing or shortness of breath. ?  ?fluticasone 50 MCG/ACT nasal spray ?Commonly known as: FLONASE ?Place 2 sprays into the nose daily. ?  ?Omron 3 Series BP Monitor Devi ?Use as directed ?(1 Device by Does not apply route as needed.) ?  ?ondansetron 8 MG disintegrating tablet ?Commonly known as: ZOFRAN-ODT ?Take 1 tablet (8 mg total) by mouth every 8 (eight) hours as needed for Nausea for up to 7 days ?  ?PRENATAL VITAMIN PO ?Take 1 tablet by mouth daily. ?  ? ?  ? ? Follow-up Information   ? ? [facility]  Follow up.   ?Specialty: Obstetrics and Gynecology ?Why: As scheduled ?Contact information: ?[address] ?[place] [id number] ?[phone] ? ?  ?  ? ? [place] [facility]

## 2021-04-23 NOTE — MAU Note (Signed)
Laurie Rogers is a 31 y.o. at 41w0dhere in MAU reporting: Pt reports low blood pressure for the last 3 days. Pt reports headache that comes and goes with her low blood pressure. Pt reports discomfort in the abdomen for the last 3 weeks. ?Onset of complaint: 3 days ago  ?Pain score: 8/10 headache  ?There were no vitals filed for this visit.   ? ?Lab orders placed from triage:  UA ? ?

## 2021-04-24 DIAGNOSIS — O26892 Other specified pregnancy related conditions, second trimester: Secondary | ICD-10-CM | POA: Diagnosis not present

## 2021-04-24 DIAGNOSIS — Z3A14 14 weeks gestation of pregnancy: Secondary | ICD-10-CM | POA: Diagnosis not present

## 2021-04-24 DIAGNOSIS — Z885 Allergy status to narcotic agent status: Secondary | ICD-10-CM | POA: Diagnosis not present

## 2021-04-24 DIAGNOSIS — R519 Headache, unspecified: Secondary | ICD-10-CM | POA: Diagnosis not present

## 2021-04-24 DIAGNOSIS — K5900 Constipation, unspecified: Secondary | ICD-10-CM | POA: Diagnosis not present

## 2021-04-24 DIAGNOSIS — O99612 Diseases of the digestive system complicating pregnancy, second trimester: Secondary | ICD-10-CM | POA: Diagnosis not present

## 2021-04-25 ENCOUNTER — Other Ambulatory Visit: Payer: Self-pay | Admitting: Family Medicine

## 2021-04-25 DIAGNOSIS — Z3687 Encounter for antenatal screening for uncertain dates: Secondary | ICD-10-CM

## 2021-04-28 ENCOUNTER — Ambulatory Visit: Payer: 59

## 2021-04-28 ENCOUNTER — Ambulatory Visit: Payer: 59 | Attending: Family Medicine

## 2021-04-28 ENCOUNTER — Other Ambulatory Visit: Payer: Self-pay | Admitting: *Deleted

## 2021-04-28 ENCOUNTER — Ambulatory Visit: Admission: RE | Admit: 2021-04-28 | Payer: 59 | Source: Ambulatory Visit

## 2021-04-28 ENCOUNTER — Other Ambulatory Visit: Payer: Self-pay

## 2021-04-28 ENCOUNTER — Other Ambulatory Visit: Payer: Self-pay | Admitting: Family Medicine

## 2021-04-28 DIAGNOSIS — Z3687 Encounter for antenatal screening for uncertain dates: Secondary | ICD-10-CM

## 2021-04-28 DIAGNOSIS — O09299 Supervision of pregnancy with other poor reproductive or obstetric history, unspecified trimester: Secondary | ICD-10-CM

## 2021-05-01 ENCOUNTER — Ambulatory Visit: Payer: 59

## 2021-05-01 ENCOUNTER — Ambulatory Visit: Payer: 59 | Attending: Family Medicine | Admitting: *Deleted

## 2021-05-02 ENCOUNTER — Encounter: Payer: 59 | Admitting: Obstetrics and Gynecology

## 2021-05-06 ENCOUNTER — Ambulatory Visit (INDEPENDENT_AMBULATORY_CARE_PROVIDER_SITE_OTHER): Payer: 59 | Admitting: Family Medicine

## 2021-05-06 ENCOUNTER — Encounter: Payer: Self-pay | Admitting: Family Medicine

## 2021-05-06 VITALS — BP 106/69 | HR 90 | Wt 133.1 lb

## 2021-05-06 DIAGNOSIS — D249 Benign neoplasm of unspecified breast: Secondary | ICD-10-CM

## 2021-05-06 DIAGNOSIS — Z3A13 13 weeks gestation of pregnancy: Secondary | ICD-10-CM

## 2021-05-06 DIAGNOSIS — O09299 Supervision of pregnancy with other poor reproductive or obstetric history, unspecified trimester: Secondary | ICD-10-CM | POA: Diagnosis not present

## 2021-05-06 DIAGNOSIS — O0990 Supervision of high risk pregnancy, unspecified, unspecified trimester: Secondary | ICD-10-CM

## 2021-05-06 MED ORDER — ASPIRIN EC 81 MG PO TBEC: 81.0000 mg | DELAYED_RELEASE_TABLET | Freq: Every day | ORAL | 2 refills | Status: DC

## 2021-05-06 NOTE — Progress Notes (Signed)
? ?History:  ? Laurie Rogers is a 31 y.o. G3P1011 at 25w3dby early ultrasound (128 [place] being seen today for her first obstetrical visit.  Her obstetrical history is significant for  pre-eclampsia without severe features in last pregnancy . She also reports a history of a breast adenoma s/p removal. Patient does intend to breast feed. Pregnancy history fully reviewed. ? ?Patient reports  she is overall doing well, still having occasional headaches that improve with time/rest and bilateral breast soreness on the lateral aspects . She has not felt any lumps or bumps on her breasts.  ?  ?  ?HISTORY: ?OB History  ?Gravida Para Term Preterm AB Living  ?'3 1 1 ' 0 1 1  ?SAB IAB Ectopic Multiple Live Births  ?1 0 0 0 1  ?  ?# Outcome Date GA Lbr Len/2nd Weight Sex Delivery Anes PTL Lv  ?3 Current           ?2 Term 02/22/14 416w2d7 lb 12 oz (3.515 kg) F Vag-Spont EPI  LIV  ?   Birth Comments: pre-eclampsia toward end of pregnancy, IOL for pre-eclampsia and decresed fetal movement  ?1 SAB 2012 5w36w0d      ?  ?Last pap smear was done 02/2020 and was normal (care everywhere)  ? ?Past Medical History:  ?Diagnosis Date  ? Fibroadenoma   ? Pre-eclampsia 2016  ? ?Past Surgical History:  ?Procedure Laterality Date  ? BREAST SURGERY    ? fibroadenoma removed  ? ?Family History  ?Problem Relation Age of Onset  ? Diabetes Father   ? Diabetes Brother   ? ?Social History  ? ?Tobacco Use  ? Smoking status: Never  ? Smokeless tobacco: Never  ?Vaping Use  ? Vaping Use: Never used  ?Substance Use Topics  ? Alcohol use: Not Currently  ?  Comment: occasionally  ? Drug use: Never  ? ?Allergies  ?Allergen Reactions  ? Oxycodone Hives, Other (See Comments) and Rash  ?  Other reaction(s): Headache ?Hot flashes ?Hot flashes, night sweats, and mild rash (on arm) ?  ? ?Current Outpatient Medications on File Prior to Visit  ?Medication Sig Dispense Refill  ? Blood Pressure Monitoring (BLOOD PRESSURE KIT) DEVI 1 Device by Does not apply  route as needed. 1 each 0  ? ?No current facility-administered medications on file prior to visit.  ? ? ?Review of Systems ?Pertinent items noted in HPI and remainder of comprehensive ROS otherwise negative. ?Physical Exam:  ? ?Vitals:  ? 05/06/21 0910  ?BP: 106/69  ?Pulse: 90  ?Weight: 133 lb 1.6 oz (60.4 kg)  ? ?Fetal Heart Rate (bpm): 163 ? ?Constitutional: Well-developed, well-nourished pregnant female in no acute distress.  ?HEENT: PERRLA ?Skin: normal color and turgor, no rash ?Breasts: right breast normal without mass or axillary nodes, left breast normal without mass or axillary nodes. Non-specific tenderness on the lateral aspects of both breasts with palpation.  ?Cardiovascular: normal rate  ?Respiratory: normal effort ?GI: Abd soft, non-tender ?MS: no edema, spontaneous movement of all extremities  ?Neurologic: Alert and oriented x 4. EOMI.  ?GU: no CVA tenderness ? ?Assessment:  ?  ?Pregnancy: G3P1011 ?Patient Active Problem List  ? Diagnosis Date Noted  ? Supervision of high risk pregnancy, antepartum 04/17/2021  ? History of pre-eclampsia in prior pregnancy, currently pregnant 04/17/2021  ? Fibroadenoma   ? H/O transfusion of packed red blood cells 04/25/2014  ? 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 ?  ?Plan:  ?  ?  1. Supervision of high risk pregnancy, antepartum ?Doing well overall. ?- CBC/D/Plt+RPR+Rh+ABO+RubIgG... ?- Culture, OB Urine ?- Hemoglobin A1c ? ?2. [redacted] weeks gestation of pregnancy ? ?3. History of pre-eclampsia in prior pregnancy, currently pregnant ?BP WNL. Start ASA. Already has BP cuff at home.  ? ?4. Breast fibroadenoma in female, unspecified laterality ?Previous history s/p removal. No abnormality on breast exam today.  ? ?5. Interested in water birth  ?Plan to schedule with CNM for next visit. She is aware if she develops a hypertensive disorder in this pregnancy that this would negate her ability to do a water birth.  ? ?6. Headaches in pregnancy  ?Mild per patient report without  neurological s/sx. She wishes to avoid medication. Discussed rest when needed, cool or warm wash clothes, neck/back massage, and small amounts of caffeine (esp as she used to be a daily coffee drinker). Discussed red flags (weakness, numbness, dizziness etc).  ? ?Initial labs drawn. ?Continue prenatal vitamins. ?Problem list reviewed and updated. ?Genetic Screening discussed, request invitae cell free DNA through MFM (collected 3/30).  ?Ultrasound discussed; fetal anatomic survey: already scheduled  ?Anticipatory guidance about prenatal visits given including labs, ultrasounds, and testing. ?Discussed usage of Babyscripts and virtual visits as additional source of managing and completing prenatal visits in midst of coronavirus and pandemic.   ?Encouraged to complete MyChart Registration for her ability to review results, send requests, and have questions addressed.  ?The nature of [place] for [facility] with multiple MDs and Advanced Practice Providers was explained to patient; also emphasized that residents, students are part of our team. ?Routine obstetric precautions reviewed. Encouraged to seek out care at office or emergency room [facility] preferred) for urgent and/or emergent concerns. ? ?Return in about 4 weeks (around 06/03/2021) for [place] with CNM .  ?  ? ?Darrelyn Hillock, DO ?Ob Fellow  ? ?05/06/2021  9:48 AM ? ? ?  ? ? ?

## 2021-05-06 NOTE — Patient Instructions (Signed)

## 2021-05-07 ENCOUNTER — Telehealth: Payer: Self-pay | Admitting: Genetics

## 2021-05-07 LAB — HCV INTERPRETATION

## 2021-05-07 LAB — HEMOGLOBIN A1C
Est. average glucose Bld gHb Est-mCnc: 100 mg/dL
Hgb A1c MFr Bld: 5.1 % (ref 4.8–5.6)

## 2021-05-07 LAB — CBC/D/PLT+RPR+RH+ABO+RUBIGG...
Antibody Screen: NEGATIVE
Basophils Absolute: 0 10*3/uL (ref 0.0–0.2)
Basos: 0 %
EOS (ABSOLUTE): 0 10*3/uL (ref 0.0–0.4)
Eos: 1 %
HCV Ab: NONREACTIVE
HIV Screen 4th Generation wRfx: NONREACTIVE
Hematocrit: 36.7 % (ref 34.0–46.6)
Hemoglobin: 12.6 g/dL (ref 11.1–15.9)
Hepatitis B Surface Ag: NEGATIVE
Immature Grans (Abs): 0 10*3/uL (ref 0.0–0.1)
Immature Granulocytes: 1 %
Lymphocytes Absolute: 1.2 10*3/uL (ref 0.7–3.1)
Lymphs: 19 %
MCH: 31.7 pg (ref 26.6–33.0)
MCHC: 34.3 g/dL (ref 31.5–35.7)
MCV: 92 fL (ref 79–97)
Monocytes Absolute: 0.3 10*3/uL (ref 0.1–0.9)
Monocytes: 5 %
Neutrophils Absolute: 4.5 10*3/uL (ref 1.4–7.0)
Neutrophils: 74 %
Platelets: 240 10*3/uL (ref 150–450)
RBC: 3.98 x10E6/uL (ref 3.77–5.28)
RDW: 13.3 % (ref 11.7–15.4)
RPR Ser Ql: NONREACTIVE
Rh Factor: POSITIVE
Rubella Antibodies, IGG: 4.58 index (ref >0.99)
WBC: 6 10*3/uL (ref 3.4–10.8)

## 2021-05-07 NOTE — Telephone Encounter (Signed)
Riely was contacted by telephone to review their noninvasive prenatal screening (NIPS) result. The result is low risk. This screening significantly reduces the risk that the current pregnancy has Down syndrome, Trisomy 47, Trisomy 13, Monosomy X, and Triploidy. Sharvi understands that this is a screening and not a diagnostic test. All questions answered. ? ?Jo requested that genetic counseling call her mother and her best friend with the fetal sex information. Genetic counseling called Gaspar Cola (mother) and Aline August (best friend) with this information.  ?

## 2021-05-08 ENCOUNTER — Other Ambulatory Visit: Payer: Self-pay

## 2021-05-08 LAB — URINE CULTURE, OB REFLEX

## 2021-05-08 LAB — CULTURE, OB URINE

## 2021-05-09 LAB — PROTEIN / CREATININE RATIO, URINE
Creatinine, Urine: 94.7 mg/dL
Protein, Ur: 7.4 mg/dL
Protein/Creat Ratio: 78 mg/g creat (ref 0–200)

## 2021-05-16 ENCOUNTER — Encounter: Payer: Self-pay | Admitting: *Deleted

## 2021-05-26 ENCOUNTER — Encounter: Payer: Self-pay | Admitting: Certified Nurse Midwife

## 2021-05-26 DIAGNOSIS — Z20822 Contact with and (suspected) exposure to covid-19: Secondary | ICD-10-CM | POA: Diagnosis not present

## 2021-05-27 DIAGNOSIS — Z20822 Contact with and (suspected) exposure to covid-19: Secondary | ICD-10-CM | POA: Diagnosis not present

## 2021-05-27 DIAGNOSIS — U071 COVID-19: Secondary | ICD-10-CM | POA: Diagnosis not present

## 2021-06-04 ENCOUNTER — Ambulatory Visit (INDEPENDENT_AMBULATORY_CARE_PROVIDER_SITE_OTHER): Payer: 59 | Admitting: Certified Nurse Midwife

## 2021-06-04 VITALS — BP 108/66 | HR 81 | Wt 134.2 lb

## 2021-06-04 DIAGNOSIS — Z3A17 17 weeks gestation of pregnancy: Secondary | ICD-10-CM

## 2021-06-04 DIAGNOSIS — O26892 Other specified pregnancy related conditions, second trimester: Secondary | ICD-10-CM

## 2021-06-04 DIAGNOSIS — O0992 Supervision of high risk pregnancy, unspecified, second trimester: Secondary | ICD-10-CM

## 2021-06-04 DIAGNOSIS — H9201 Otalgia, right ear: Secondary | ICD-10-CM

## 2021-06-04 DIAGNOSIS — R3989 Other symptoms and signs involving the genitourinary system: Secondary | ICD-10-CM

## 2021-06-04 DIAGNOSIS — O09299 Supervision of pregnancy with other poor reproductive or obstetric history, unspecified trimester: Secondary | ICD-10-CM

## 2021-06-04 DIAGNOSIS — R519 Headache, unspecified: Secondary | ICD-10-CM

## 2021-06-04 NOTE — Progress Notes (Addendum)
? ?  PRENATAL VISIT NOTE ? ?Subjective:  ?Laurie Rogers is a 31 y.o. G3P1011 at 2w4dbeing seen today for ongoing prenatal care.  She is currently monitored for the following issues for this high-risk pregnancy and has Supervision of high risk pregnancy, antepartum; History of pre-eclampsia in prior pregnancy, currently pregnant; Fibroadenoma; Benign neoplasm of breast; and H/O transfusion of packed red blood cells on their problem list. ? ?Patient reports  R ear fullness and discomfort, pressure with urination as if "not fully emptying my bladder and it feels like she is sitting right there like she wants to come out", and persistent HA. She reports not taking OTC medications d/t fear of harming developing fetus .  Contractions: Not present. Vag. Bleeding: None.  Movement: Present. Denies leaking of fluid.  ? ?The following portions of the patient's history were reviewed and updated as appropriate: allergies, current medications, past family history, past medical history, past social history, past surgical history and problem list.  ? ?Objective:  ? ?Vitals:  ? 06/04/21 1017  ?BP: 108/66  ?Pulse: 81  ?Weight: 60.9 kg  ? ? ?Fetal Status: Fetal Heart Rate (bpm): 159   Movement: Present    ? ?General:  Alert, oriented and cooperative. Patient is in no acute distress.  ?Skin: Skin is warm and dry. No rash noted.   ?Cardiovascular: Normal heart rate noted  ?Respiratory: Normal respiratory effort, no problems with respiration noted  ?Abdomen: Soft, gravid, appropriate for gestational age.  Pain/Pressure: Absent     ?Pelvic: Cervical exam deferred        ?Extremities: Normal range of motion.  Edema: None  ?Mental Status: Normal mood and affect. Normal behavior. Normal judgment and thought content.  ? ?Assessment and Plan:  ?Pregnancy: G3P1011 at 123w4d1. Supervision of high risk pregnancy in second trimester ?- Doing well, appropriately feeling irregular fetal movement  ? ?2. [redacted] weeks gestation of pregnancy ?-  Routine OB care  ? ?3. History of pre-eclampsia in prior pregnancy, currently pregnant ?- Educated risks v benefit of ASA therapy for pre-eclampsia prophylaxis. All questions asked were answered and pt reports that she will begin taking routinely ? ?4. Ear pain, right ?- Referred to Dr. EcMendel Corningn office for R sided ear lavage. Performed in office today by same ? ?5. Pregnancy headache in second trimester ?- Counseled on use of acetaminophen for HA in addition to adequate hydration and rest ?- Encouraged to use Flonase for persistent mucus drainage s/p COVID-19 infection prior to pregnancy ? ?6. Sensation of pressure in bladder area ?- UA collected today in office for culture. Increasing or persistent urinary s/s reviewed, pt verbalized understanding ? ?Preterm labor symptoms and general obstetric precautions including but not limited to vaginal bleeding, contractions, leaking of fluid and fetal movement were reviewed in detail with the patient. ?Please refer to After Visit Summary for other counseling recommendations.  ? ?Return in about 4 weeks (around 07/02/2021) for IN-PERSON, LOB. ? ?Future Appointments  ?Date Time Provider [place]?06/19/2021  9:30 AM WMC-MFC NURSE WMC-MFC WMC  ?06/19/2021  9:45 AM WMC-MFC US4 WMC-MFCUS WMC  ?07/02/2021  2:55 PM WaGabriel CarinaCNM [facility]MC[facility]CWH [facility]? ? ?Renetta Suman S Rosaland Shiffman, Student-MidWife  ?

## 2021-06-05 ENCOUNTER — Other Ambulatory Visit: Payer: Self-pay

## 2021-06-19 ENCOUNTER — Ambulatory Visit: Payer: 59 | Attending: Obstetrics | Admitting: Obstetrics

## 2021-06-19 ENCOUNTER — Other Ambulatory Visit: Payer: Self-pay | Admitting: *Deleted

## 2021-06-19 ENCOUNTER — Ambulatory Visit: Payer: 59 | Attending: Family Medicine

## 2021-06-19 ENCOUNTER — Ambulatory Visit: Payer: 59 | Admitting: *Deleted

## 2021-06-19 ENCOUNTER — Encounter: Payer: Self-pay | Admitting: Certified Nurse Midwife

## 2021-06-19 VITALS — BP 109/64 | HR 90

## 2021-06-19 DIAGNOSIS — O09299 Supervision of pregnancy with other poor reproductive or obstetric history, unspecified trimester: Secondary | ICD-10-CM | POA: Insufficient documentation

## 2021-06-19 DIAGNOSIS — O0990 Supervision of high risk pregnancy, unspecified, unspecified trimester: Secondary | ICD-10-CM

## 2021-06-19 DIAGNOSIS — Z3A19 19 weeks gestation of pregnancy: Secondary | ICD-10-CM | POA: Diagnosis not present

## 2021-06-19 DIAGNOSIS — O3503X0 Maternal care for (suspected) central nervous system malformation or damage in fetus, choroid plexus cysts, not applicable or unspecified: Secondary | ICD-10-CM

## 2021-06-19 DIAGNOSIS — O09292 Supervision of pregnancy with other poor reproductive or obstetric history, second trimester: Secondary | ICD-10-CM | POA: Diagnosis not present

## 2021-06-19 DIAGNOSIS — O283 Abnormal ultrasonic finding on antenatal screening of mother: Secondary | ICD-10-CM

## 2021-06-19 NOTE — Progress Notes (Signed)
MFM Note  Laurie Rogers was seen for a detailed fetal anatomy scan.   She denies any significant past medical history and denies any problems in her current pregnancy.    She had a cell free DNA test earlier in her pregnancy which indicated a low risk for trisomy 43, 78, and 13. A female fetus is predicted.  She was informed that the fetal growth and amniotic fluid level were appropriate for her gestational age.  Today's exam confirms an [facility] of November 08, 2021.  On today's exam, a left-sided choroid plexus cyst was noted in the fetal brain.    The implications and management of choroid plexus cysts was discussed today.  She was advised that the choroid plexus cysts are most likely normal variants and will usually resolve at around 24 weeks.    The small association of bilateral choroid plexus cysts with trisomy 18 was discussed.  She was advised that as no other anomalies were noted on today's ultrasound exam, it is highly unlikely that her fetus has trisomy 62.    Due to the small association between choroid plexus cysts and trisomy 18, she was offered and declined an amniocentesis today for definitive diagnosis of fetal aneuploidy.    A follow-up exam was scheduled in 5 weeks for assessment of the choroid plexus cyst.    The patient stated that all of her questions were answered today.  A total of 20 minutes was spent counseling and coordinating the care for this patient.  Greater than 50% of the time was spent in direct face-to-face contact.

## 2021-07-02 ENCOUNTER — Ambulatory Visit (INDEPENDENT_AMBULATORY_CARE_PROVIDER_SITE_OTHER): Payer: 59 | Admitting: Certified Nurse Midwife

## 2021-07-02 VITALS — BP 114/66 | HR 89 | Wt 139.6 lb

## 2021-07-02 DIAGNOSIS — O0990 Supervision of high risk pregnancy, unspecified, unspecified trimester: Secondary | ICD-10-CM

## 2021-07-02 DIAGNOSIS — Z3A21 21 weeks gestation of pregnancy: Secondary | ICD-10-CM

## 2021-07-02 DIAGNOSIS — Z3482 Encounter for supervision of other normal pregnancy, second trimester: Secondary | ICD-10-CM | POA: Diagnosis not present

## 2021-07-02 DIAGNOSIS — O09299 Supervision of pregnancy with other poor reproductive or obstetric history, unspecified trimester: Secondary | ICD-10-CM

## 2021-07-02 DIAGNOSIS — Z3483 Encounter for supervision of other normal pregnancy, third trimester: Secondary | ICD-10-CM | POA: Diagnosis not present

## 2021-07-04 ENCOUNTER — Other Ambulatory Visit: Payer: Self-pay

## 2021-07-06 NOTE — Progress Notes (Signed)
   PRENATAL VISIT NOTE  Subjective:  Laurie Rogers is a 31 y.o. G3P1011 at 39w1dbeing seen today for ongoing prenatal care.  She is currently monitored for the following issues for this low-risk pregnancy and has Supervision of high risk pregnancy, antepartum; History of pre-eclampsia in prior pregnancy, currently pregnant; Fibroadenoma; Benign neoplasm of breast; and H/O transfusion of packed red blood cells on their problem list.  Patient reports no complaints.  Contractions: Not present. Vag. Bleeding: None.  Movement: Present. Denies leaking of fluid.   The following portions of the patient's history were reviewed and updated as appropriate: allergies, current medications, past family history, past medical history, past social history, past surgical history and problem list.   Objective:   Vitals:   07/02/21 1522  BP: 114/66  Pulse: 89  Weight: 139 lb 9.6 oz (63.3 kg)    Fetal Status: Fetal Heart Rate (bpm): 150   Movement: Present     General:  Alert, oriented and cooperative. Patient is in no acute distress.  Skin: Skin is warm and dry. No rash noted.   Cardiovascular: Normal heart rate noted  Respiratory: Normal respiratory effort, no problems with respiration noted  Abdomen: Soft, gravid, appropriate for gestational age.  Pain/Pressure: Absent     Pelvic: Cervical exam deferred        Extremities: Normal range of motion.  Edema: None  Mental Status: Normal mood and affect. Normal behavior. Normal judgment and thought content.   Assessment and Plan:  Pregnancy: G3P1011 at 235w1d. Supervision of high risk pregnancy, antepartum - Doing well, feeling regular and vigorous fetal movement   2. [redacted] weeks gestation of pregnancy - Routine OB care  - Pt is interested in waterbirth.  No contraindications at this time per chart review/patient assessment.   - Pt to enroll in class, see CNMs for most visits in the office.  - Discussed waterbirth as option for low-risk  pregnancy.  Reviewed conditions that may arise during pregnancy that will risk pt out of waterbirth including hypertension, diabetes, fetal growth restriction <10%ile, etc.  3. History of pre-eclampsia in prior pregnancy, currently pregnant - BP stable, reviewed ways to prevent preeclampsia including daily ASA, increased protein intake, good hydration and decreased processed foods/sugar.  Preterm labor symptoms and general obstetric precautions including but not limited to vaginal bleeding, contractions, leaking of fluid and fetal movement were reviewed in detail with the patient. Please refer to After Visit Summary for other counseling recommendations.   Return in about 4 weeks (around 07/30/2021) for IN-PERSON, LOB.  Future Appointments  Date Time Provider De[place]6/22/2023 11:15 AM [facility]6/22/2023 11:30 AM WMC-MFC US3 [facility]MFCUS WM[facility]6/28/2023  8:15 AM WaGabriel CarinaCNM WMC-CWH [facility]  JaGabriel CarinaCNM

## 2021-07-24 ENCOUNTER — Encounter: Payer: Self-pay | Admitting: *Deleted

## 2021-07-24 ENCOUNTER — Other Ambulatory Visit: Payer: Self-pay | Admitting: *Deleted

## 2021-07-24 ENCOUNTER — Ambulatory Visit: Payer: 59 | Admitting: *Deleted

## 2021-07-24 ENCOUNTER — Ambulatory Visit: Payer: 59 | Attending: Obstetrics

## 2021-07-24 VITALS — BP 112/67 | HR 93

## 2021-07-24 DIAGNOSIS — O0990 Supervision of high risk pregnancy, unspecified, unspecified trimester: Secondary | ICD-10-CM

## 2021-07-24 DIAGNOSIS — Z3A24 24 weeks gestation of pregnancy: Secondary | ICD-10-CM

## 2021-07-24 DIAGNOSIS — O283 Abnormal ultrasonic finding on antenatal screening of mother: Secondary | ICD-10-CM | POA: Insufficient documentation

## 2021-07-24 DIAGNOSIS — D249 Benign neoplasm of unspecified breast: Secondary | ICD-10-CM

## 2021-07-24 DIAGNOSIS — O09299 Supervision of pregnancy with other poor reproductive or obstetric history, unspecified trimester: Secondary | ICD-10-CM | POA: Insufficient documentation

## 2021-07-24 DIAGNOSIS — O132 Gestational [pregnancy-induced] hypertension without significant proteinuria, second trimester: Secondary | ICD-10-CM

## 2021-07-24 DIAGNOSIS — Z8759 Personal history of other complications of pregnancy, childbirth and the puerperium: Secondary | ICD-10-CM

## 2021-07-24 DIAGNOSIS — O09292 Supervision of pregnancy with other poor reproductive or obstetric history, second trimester: Secondary | ICD-10-CM

## 2021-07-30 ENCOUNTER — Ambulatory Visit (INDEPENDENT_AMBULATORY_CARE_PROVIDER_SITE_OTHER): Payer: 59 | Admitting: Certified Nurse Midwife

## 2021-07-30 ENCOUNTER — Encounter: Payer: Self-pay | Admitting: Certified Nurse Midwife

## 2021-07-30 VITALS — BP 114/71 | HR 113 | Wt 145.6 lb

## 2021-07-30 DIAGNOSIS — O09299 Supervision of pregnancy with other poor reproductive or obstetric history, unspecified trimester: Secondary | ICD-10-CM

## 2021-07-30 DIAGNOSIS — O219 Vomiting of pregnancy, unspecified: Secondary | ICD-10-CM

## 2021-07-30 DIAGNOSIS — Z3A25 25 weeks gestation of pregnancy: Secondary | ICD-10-CM

## 2021-07-30 DIAGNOSIS — O0992 Supervision of high risk pregnancy, unspecified, second trimester: Secondary | ICD-10-CM

## 2021-07-30 MED ORDER — ONDANSETRON 8 MG PO TBDP: 8.0000 mg | ORAL_TABLET | Freq: Three times a day (TID) | ORAL | 0 refills | Status: DC | PRN

## 2021-07-30 NOTE — Patient Instructions (Signed)
  Considering Waterbirth? Guide for patients at [facility] [employer] [place]) Why consider waterbirth? Gentle birth for babies  Less pain medicine used in labor  May allow for passive descent/less pushing  May reduce perineal tears  More mobility and instinctive maternal position changes  Increased maternal relaxation   Is waterbirth safe? What are the risks of infection, drowning or other complications? Infection:  Very low risk (3.7 % for tub vs 4.8% for bed)  7 in 8000 waterbirths with documented infection  Poorly cleaned equipment most common cause  Slightly lower group B strep transmission rate  Drowning  Maternal:  Very low risk  Related to seizures or fainting  Newborn:  Very low risk. No evidence of increased risk of respiratory problems in multiple large studies  Physiological protection from breathing under water  Avoid underwater birth if there are any fetal complications  Once baby's head is out of the water, keep it out.  Birth complication  Some reports of cord trauma, but risk decreased by bringing baby to surface gradually  No evidence of increased risk of shoulder dystocia. Mothers can usually change positions faster in water than in a bed, possibly aiding the maneuvers to free the shoulder.   There are 2 things you MUST do to have a waterbirth with [facility]: Attend a waterbirth class at [place] at [facility]   3rd Wednesday of every month from 7-9 pm (virtual during [place]) [employer] at [email or web address] or [email or web address] or by calling [phone] Bring [place] the certificate from the class to your prenatal appointment or send via [place] with a midwife at 36 weeks* to see if you can still plan a waterbirth and to sign the consent.   *We also recommend that you schedule as many of your prenatal visits with a midwife as possible.    Helpful information: You may want to bring a bathing suit top to the hospital  to wear during labor but this is optional.  All other supplies are provided by the hospital. Please arrive at the hospital with signs of active labor, and do not wait at home until late in labor. It takes 45 min- 1 hour for fetal monitoring, and check in to your room to take place, plus transport and filling of the waterbirth tub.    Things that would prevent you from having a waterbirth: Premature, <37wks  Previous cesarean birth  Presence of thick meconium-stained fluid  Multiple gestation (Twins, triplets, etc.)  Uncontrolled diabetes or gestational diabetes requiring medication  Hypertension diagnosed in pregnancy or preexisting hypertension (gestational hypertension, preeclampsia, or chronic hypertension) Fetal growth restriction (your baby measures less than 10th percentile on ultrasound) Heavy vaginal bleeding  Non-reassuring fetal heart rate  Active infection (MRSA, etc.). Group B Strep is NOT a contraindication for waterbirth.  If your labor has to be induced and induction method requires continuous monitoring of the baby's heart rate  Other risks/issues identified by your obstetrical provider   Please remember that birth is unpredictable. Under certain unforeseeable circumstances your provider may advise against giving birth in the tub. These decisions will be made on a case-by-case basis and with the safety of you and your baby as our highest priority.    Updated 05/07/21

## 2021-07-30 NOTE — Progress Notes (Signed)
   PRENATAL VISIT NOTE  Subjective:  Laurie Rogers is a 31 y.o. G3P1011 at 37w4dbeing seen today for ongoing prenatal care.  She is currently monitored for the following issues for this high-risk pregnancy and has Supervision of high risk pregnancy, antepartum; History of pre-eclampsia in prior pregnancy, currently pregnant; Fibroadenoma; Benign neoplasm of breast; and H/O transfusion of packed red blood cells on their problem list.  Patient reports  feeling well overall, nauseated this morning because she only ate a small waffle for breakfast .  Contractions: Not present. Vag. Bleeding: None.  Movement: Present. Denies leaking of fluid.   The following portions of the patient's history were reviewed and updated as appropriate: allergies, current medications, past family history, past medical history, past social history, past surgical history and problem list.   Objective:   Vitals:   07/30/21 0818  BP: 114/71  Pulse: (!) 113  Weight: 145 lb 9.6 oz (66 kg)   Fetal Status: Fetal Heart Rate (bpm): 145 Fundal Height: 25 cm Movement: Present     General:  Alert, oriented and cooperative. Patient is in no acute distress.  Skin: Skin is warm and dry. No rash noted.   Cardiovascular: Normal heart rate noted  Respiratory: Normal respiratory effort, no problems with respiration noted  Abdomen: Soft, gravid, appropriate for gestational age.  Pain/Pressure: Absent     Pelvic: Cervical exam deferred        Extremities: Normal range of motion.  Edema: None  Mental Status: Normal mood and affect. Normal behavior. Normal judgment and thought content.   Assessment and Plan:  Pregnancy: G3P1011 at 218w4d. Supervision of high risk pregnancy in second trimester - Doing well, feeling regular and vigorous fetal movement   2. [redacted] weeks gestation of pregnancy - Routine OB care including anticipatory guidance re GTT at next visit. - Pt is interested in waterbirth.  No contraindications at this  time per chart review/patient assessment.   - Pt to enroll in class, see CNMs for most visits in the office.  - Discussed waterbirth as option for low-risk pregnancy.  Reviewed conditions that may arise during pregnancy that will risk pt out of waterbirth including hypertension, diabetes, fetal growth restriction <10%ile, etc.  3. History of pre-eclampsia in prior pregnancy, currently pregnant - BP stable  4. Nausea and vomiting during pregnancy - Nauseated today because of low morning intake, suggested premedicating for her GTT with zofran. - ondansetron (ZOFRAN-ODT) 8 MG disintegrating tablet; Take 1 tablet (8 mg total) by mouth every 8 (eight) hours as needed for nausea or vomiting.  Dispense: 5 tablet; Refill: 0  {Blank single:19197::"Term","Preterm"} labor symptoms and general obstetric precautions including but not limited to vaginal bleeding, contractions, leaking of fluid and fetal movement were reviewed in detail with the patient. Please refer to After Visit Summary for other counseling recommendations.   Return in about 2 weeks (around 08/13/2021) for IN-PERSON, LOB/GTT.  Future Appointments  Date Time Provider De[place]7/11/2021  8:55 AM SmMinette BrineMHalifax [facility]MS[facility]7/21/2023 12:30 PM WMPe[facility]URSE WMPris[facility]MR[facility]7/21/2023 12:45 PM WMC-MFC US4 WMC-MFCUS WMS[place]  JaGabriel CarinaCNM

## 2021-08-06 DIAGNOSIS — D709 Neutropenia, unspecified: Secondary | ICD-10-CM | POA: Diagnosis not present

## 2021-08-06 DIAGNOSIS — D649 Anemia, unspecified: Secondary | ICD-10-CM | POA: Diagnosis not present

## 2021-08-11 ENCOUNTER — Other Ambulatory Visit: Payer: Self-pay

## 2021-08-11 ENCOUNTER — Ambulatory Visit (INDEPENDENT_AMBULATORY_CARE_PROVIDER_SITE_OTHER): Payer: 59 | Admitting: Advanced Practice Midwife

## 2021-08-11 VITALS — BP 108/77 | HR 89 | Wt 147.0 lb

## 2021-08-11 DIAGNOSIS — Z3A27 27 weeks gestation of pregnancy: Secondary | ICD-10-CM

## 2021-08-11 DIAGNOSIS — O0990 Supervision of high risk pregnancy, unspecified, unspecified trimester: Secondary | ICD-10-CM | POA: Diagnosis not present

## 2021-08-11 NOTE — Progress Notes (Signed)
   PRENATAL VISIT NOTE  Subjective:  Laurie Rogers is a 31 y.o. G3P1011 at 99w2dbeing seen today for ongoing prenatal care.  She is currently monitored for the following issues for this low-risk pregnancy and has Supervision of high risk pregnancy, antepartum; History of pre-eclampsia in prior pregnancy, currently pregnant; Fibroadenoma; Benign neoplasm of breast; and H/O transfusion of packed red blood cells on their problem list.  Patient reports nausea.  Contractions: Not present. Vag. Bleeding: None.  Movement: Present. Denies leaking of fluid.   The following portions of the patient's history were reviewed and updated as appropriate: allergies, current medications, past family history, past medical history, past social history, past surgical history and problem list.   Objective:   Vitals:   08/11/21 0904  BP: 108/77  Pulse: 89  Weight: 147 lb (66.7 kg)    Fetal Status: Fetal Heart Rate (bpm): 144 Fundal Height: 28 cm Movement: Present     General:  Alert, oriented and cooperative. Patient is in no acute distress.  Skin: Skin is warm and dry. No rash noted.   Cardiovascular: Normal heart rate noted  Respiratory: Normal respiratory effort, no problems with respiration noted  Abdomen: Soft, gravid, appropriate for gestational age.  Pain/Pressure: Absent     Pelvic: Cervical exam deferred        Extremities: Normal range of motion.  Edema: None  Mental Status: Normal mood and affect. Normal behavior. Normal judgment and thought content.   Assessment and Plan:  Pregnancy: G3P1011 at 244w2d. Supervision of high risk pregnancy, antepartum - Undecided about TDaP. Info given - Glucose Tolerance, 2 Hours w/1 Hour - HIV Antibody (routine testing w rflx) - RPR - CBC - Pt is interested in waterbirth.  No contraindications at this time per chart review/patient assessment.   - Pt to enroll in class, see CNMs for most visits in the office.  - Discussed waterbirth as option for  low-risk pregnancy.  Reviewed conditions that may arise during pregnancy that will risk pt out of waterbirth including hypertension, diabetes, fetal growth restriction <10%ile, etc.  2. [redacted] weeks gestation of pregnancy   Preterm labor symptoms and general obstetric precautions including but not limited to vaginal bleeding, contractions, leaking of fluid and fetal movement were reviewed in detail with the patient. Please refer to After Visit Summary for other counseling recommendations.   Return in about 2 weeks (around 08/25/2021) for ROB.  Future Appointments  Date Time Provider DeHopeland7/24/2023  9:30 AM WMB[facility] ClubURSE WMSurgic[facility] Surgical [facility] LLCM[facility]7/24/2023  9:45 AM WMC-MFC US4 WMC-MFCUS WMScl [facility]8/03/2021  8:15 AM WaHelaine ChessMMethodist Healthcare [facility] HospitalMThe [facility]8/16/2023 10:35 AM WaGabriel CarinaCNM WMLake Chelan Community HospitalMMariesCNNorth Dakota

## 2021-08-11 NOTE — Patient Instructions (Signed)

## 2021-08-12 LAB — GLUCOSE TOLERANCE, 2 HOURS W/ 1HR
Glucose, 1 hour: 147 mg/dL (ref 70–179)
Glucose, 2 hour: 141 mg/dL (ref 70–152)
Glucose, Fasting: 80 mg/dL (ref 70–91)

## 2021-08-12 LAB — CBC
Hematocrit: 33 % — ABNORMAL LOW (ref 34.0–46.6)
Hemoglobin: 10.7 g/dL — ABNORMAL LOW (ref 11.1–15.9)
MCH: 28.3 pg (ref 26.6–33.0)
MCHC: 32.4 g/dL (ref 31.5–35.7)
MCV: 87 fL (ref 79–97)
Platelets: 244 10*3/uL (ref 150–450)
RBC: 3.78 x10E6/uL (ref 3.77–5.28)
RDW: 12.6 % (ref 11.7–15.4)
WBC: 9.6 10*3/uL (ref 3.4–10.8)

## 2021-08-12 LAB — RPR: RPR Ser Ql: NONREACTIVE

## 2021-08-12 LAB — HIV ANTIBODY (ROUTINE TESTING W REFLEX): HIV Screen 4th Generation wRfx: NONREACTIVE

## 2021-08-15 ENCOUNTER — Encounter: Payer: Self-pay | Admitting: Certified Nurse Midwife

## 2021-08-22 ENCOUNTER — Ambulatory Visit: Payer: 59

## 2021-08-25 ENCOUNTER — Ambulatory Visit: Payer: 59 | Attending: Obstetrics and Gynecology

## 2021-08-25 ENCOUNTER — Other Ambulatory Visit: Payer: Self-pay | Admitting: *Deleted

## 2021-08-25 ENCOUNTER — Ambulatory Visit: Payer: 59 | Admitting: *Deleted

## 2021-08-25 VITALS — BP 120/69 | HR 101

## 2021-08-25 DIAGNOSIS — O09299 Supervision of pregnancy with other poor reproductive or obstetric history, unspecified trimester: Secondary | ICD-10-CM

## 2021-08-25 DIAGNOSIS — O0990 Supervision of high risk pregnancy, unspecified, unspecified trimester: Secondary | ICD-10-CM | POA: Insufficient documentation

## 2021-08-25 DIAGNOSIS — O09293 Supervision of pregnancy with other poor reproductive or obstetric history, third trimester: Secondary | ICD-10-CM

## 2021-08-25 DIAGNOSIS — Z8759 Personal history of other complications of pregnancy, childbirth and the puerperium: Secondary | ICD-10-CM | POA: Diagnosis not present

## 2021-08-25 DIAGNOSIS — O132 Gestational [pregnancy-induced] hypertension without significant proteinuria, second trimester: Secondary | ICD-10-CM | POA: Insufficient documentation

## 2021-08-25 DIAGNOSIS — Z3A29 29 weeks gestation of pregnancy: Secondary | ICD-10-CM | POA: Diagnosis not present

## 2021-08-25 DIAGNOSIS — O09292 Supervision of pregnancy with other poor reproductive or obstetric history, second trimester: Secondary | ICD-10-CM | POA: Diagnosis not present

## 2021-09-03 ENCOUNTER — Other Ambulatory Visit: Payer: Self-pay

## 2021-09-03 ENCOUNTER — Ambulatory Visit (INDEPENDENT_AMBULATORY_CARE_PROVIDER_SITE_OTHER): Payer: 59 | Admitting: Certified Nurse Midwife

## 2021-09-03 VITALS — BP 121/80 | HR 92 | Wt 152.4 lb

## 2021-09-03 DIAGNOSIS — R102 Pelvic and perineal pain: Secondary | ICD-10-CM

## 2021-09-03 DIAGNOSIS — Z3A30 30 weeks gestation of pregnancy: Secondary | ICD-10-CM

## 2021-09-03 DIAGNOSIS — O09299 Supervision of pregnancy with other poor reproductive or obstetric history, unspecified trimester: Secondary | ICD-10-CM

## 2021-09-03 DIAGNOSIS — O26893 Other specified pregnancy related conditions, third trimester: Secondary | ICD-10-CM

## 2021-09-03 DIAGNOSIS — O0993 Supervision of high risk pregnancy, unspecified, third trimester: Secondary | ICD-10-CM

## 2021-09-03 NOTE — Progress Notes (Signed)
   PRENATAL VISIT NOTE  Subjective:  Laurie Rogers is a 31 y.o. G3P1011 at 87w4dbeing seen today for ongoing prenatal care.  She is currently monitored for the following issues for this high-risk pregnancy and has Supervision of high risk pregnancy, antepartum; History of pre-eclampsia in prior pregnancy, currently pregnant; Fibroadenoma; Benign neoplasm of breast; and H/O transfusion of packed red blood cells on their problem list.  Patient reports  increased pelvic pressure since baby is now head down .  Contractions: Irritability. Vag. Bleeding: None.  Movement: Present. Denies leaking of fluid.   The following portions of the patient's history were reviewed and updated as appropriate: allergies, current medications, past family history, past medical history, past social history, past surgical history and problem list.   Objective:   Vitals:   09/03/21 0831  BP: 121/80  Pulse: 92  Weight: 152 lb 6.4 oz (69.1 kg)    Fetal Status: Fetal Heart Rate (bpm): 141 Fundal Height: 30 cm Movement: Present     General:  Alert, oriented and cooperative. Patient is in no acute distress.  Skin: Skin is warm and dry. No rash noted.   Cardiovascular: Normal heart rate noted  Respiratory: Normal respiratory effort, no problems with respiration noted  Abdomen: Soft, gravid, appropriate for gestational age.  Pain/Pressure: Present     Pelvic: Cervical exam deferred        Extremities: Normal range of motion.  Edema: None  Mental Status: Normal mood and affect. Normal behavior. Normal judgment and thought content.   Assessment and Plan:  Pregnancy: G3P1011 at 345w4d. Supervision of high risk pregnancy in third trimester - Doing well, feeling regular and vigorous fetal movement   2. [redacted] weeks gestation of pregnancy - Routine OB care   3. History of pre-eclampsia in prior pregnancy, currently pregnant - BP stable, no s/sx PEC - Taking daily baby aspirin  4. Pelvic pressure in  pregnancy, third trimester - Demonstrated most helpful way of wearing maternity belt for maximum support, advised wearing while standing  Preterm labor symptoms and general obstetric precautions including but not limited to vaginal bleeding, contractions, leaking of fluid and fetal movement were reviewed in detail with the patient. Please refer to After Visit Summary for other counseling recommendations.   Return in about 2 weeks (around 09/17/2021) for IN-PERSON, [place] Future Appointments  Date Time Provider De[place]8/16/2023 10:35 AM WaHelaine ChessMSummit Behavioral HealthcareMM[facility]9/06/2021 10:30 AM WMC-MFC NURSE WMSaint [facility] HospitalM[facility]9/06/2021 10:45 AM WMC-MFC US4 WMC-MFCUS WMNewtonalker, CNM

## 2021-09-17 ENCOUNTER — Ambulatory Visit (INDEPENDENT_AMBULATORY_CARE_PROVIDER_SITE_OTHER): Payer: 59 | Admitting: Certified Nurse Midwife

## 2021-09-17 ENCOUNTER — Other Ambulatory Visit: Payer: Self-pay

## 2021-09-17 VITALS — BP 113/74 | HR 97 | Wt 153.3 lb

## 2021-09-17 DIAGNOSIS — O0993 Supervision of high risk pregnancy, unspecified, third trimester: Secondary | ICD-10-CM

## 2021-09-17 DIAGNOSIS — O09299 Supervision of pregnancy with other poor reproductive or obstetric history, unspecified trimester: Secondary | ICD-10-CM

## 2021-09-17 DIAGNOSIS — Z3A32 32 weeks gestation of pregnancy: Secondary | ICD-10-CM

## 2021-09-17 NOTE — Progress Notes (Signed)
   PRENATAL VISIT NOTE  Subjective:  Laurie Rogers is a 31 y.o. G3P1011 at 28w4dbeing seen today for ongoing prenatal care.  She is currently monitored for the following issues for this low-risk pregnancy and has Supervision of high risk pregnancy, antepartum; History of pre-eclampsia in prior pregnancy, currently pregnant; Fibroadenoma; Benign neoplasm of breast; and H/O transfusion of packed red blood cells on their problem list.  Patient reports no complaints.  Contractions: Not present. Vag. Bleeding: None.  Movement: Present. Denies leaking of fluid.   The following portions of the patient's history were reviewed and updated as appropriate: allergies, current medications, past family history, past medical history, past social history, past surgical history and problem list.   Objective:   Vitals:   09/17/21 1103  BP: 113/74  Pulse: 97  Weight: 153 lb 4.8 oz (69.5 kg)    Fetal Status: Fetal Heart Rate (bpm): 142 Fundal Height: 32 cm Movement: Present     General:  Alert, oriented and cooperative. Patient is in no acute distress.  Skin: Skin is warm and dry. No rash noted.   Cardiovascular: Normal heart rate noted  Respiratory: Normal respiratory effort, no problems with respiration noted  Abdomen: Soft, gravid, appropriate for gestational age.  Pain/Pressure: Present     Pelvic: Cervical exam deferred        Extremities: Normal range of motion.  Edema: None  Mental Status: Normal mood and affect. Normal behavior. Normal judgment and thought content.   Assessment and Plan:  Pregnancy: G3P1011 at 314w4d. Supervision of high risk pregnancy in third trimester - Doing well, feeling regular and vigorous fetal movement    2. [redacted] weeks gestation of pregnancy - Routine OB care   3. History of pre-eclampsia in prior pregnancy, currently pregnant - BP normal today, no s/sx PEC  Preterm labor symptoms and general obstetric precautions including but not limited to vaginal  bleeding, contractions, leaking of fluid and fetal movement were reviewed in detail with the patient. Please refer to After Visit Summary for other counseling recommendations.   Return in about 2 weeks (around 10/01/2021) for IN-PERSON, LOB.  Future Appointments  Date Time Provider DeSt. [place]9/06/2021 10:30 AM WMC-MFC NURSE [facility]MS[facility]9/06/2021 10:45 AM WMC-MFC US4 WMC-MFCUS WMRen[facility]9/07/2021  2:15 PM WaHelaine ChessMOasis Surgery Center LPMEm[facility]9/13/2023 11:15 AM WaGabriel CarinaCNM WMC-CWH WMAkron Children'S Hosp [facility]eghly  JaGabriel CarinaCNM

## 2021-10-07 ENCOUNTER — Ambulatory Visit: Payer: 59 | Attending: Obstetrics and Gynecology

## 2021-10-07 ENCOUNTER — Ambulatory Visit: Payer: 59 | Admitting: *Deleted

## 2021-10-07 ENCOUNTER — Encounter: Payer: Self-pay | Admitting: *Deleted

## 2021-10-07 VITALS — BP 121/71 | HR 91

## 2021-10-07 DIAGNOSIS — O0990 Supervision of high risk pregnancy, unspecified, unspecified trimester: Secondary | ICD-10-CM | POA: Insufficient documentation

## 2021-10-07 DIAGNOSIS — Z3A35 35 weeks gestation of pregnancy: Secondary | ICD-10-CM

## 2021-10-07 DIAGNOSIS — O09299 Supervision of pregnancy with other poor reproductive or obstetric history, unspecified trimester: Secondary | ICD-10-CM

## 2021-10-07 DIAGNOSIS — O09293 Supervision of pregnancy with other poor reproductive or obstetric history, third trimester: Secondary | ICD-10-CM | POA: Diagnosis not present

## 2021-10-08 ENCOUNTER — Other Ambulatory Visit: Payer: Self-pay

## 2021-10-08 ENCOUNTER — Ambulatory Visit (INDEPENDENT_AMBULATORY_CARE_PROVIDER_SITE_OTHER): Payer: 59 | Admitting: Certified Nurse Midwife

## 2021-10-08 ENCOUNTER — Encounter: Payer: Self-pay | Admitting: Certified Nurse Midwife

## 2021-10-08 VITALS — BP 118/81 | HR 99 | Wt 156.0 lb

## 2021-10-08 DIAGNOSIS — R252 Cramp and spasm: Secondary | ICD-10-CM

## 2021-10-08 DIAGNOSIS — Z3A35 35 weeks gestation of pregnancy: Secondary | ICD-10-CM

## 2021-10-08 DIAGNOSIS — O0993 Supervision of high risk pregnancy, unspecified, third trimester: Secondary | ICD-10-CM

## 2021-10-08 DIAGNOSIS — O99891 Other specified diseases and conditions complicating pregnancy: Secondary | ICD-10-CM

## 2021-10-08 DIAGNOSIS — O09293 Supervision of pregnancy with other poor reproductive or obstetric history, third trimester: Secondary | ICD-10-CM

## 2021-10-08 DIAGNOSIS — O09299 Supervision of pregnancy with other poor reproductive or obstetric history, unspecified trimester: Secondary | ICD-10-CM

## 2021-10-08 MED ORDER — MAGNESIUM OXIDE -MG SUPPLEMENT 200 MG PO TABS
400.0000 mg | ORAL_TABLET | Freq: Every day | ORAL | 3 refills | Status: DC
  Filled 2021-10-08: qty 60, 30d supply, fill #0

## 2021-10-08 NOTE — Patient Instructions (Signed)
Laurie Rogers w/ [address], [place] [address] [phone] [email or web address]

## 2021-10-08 NOTE — Progress Notes (Signed)
   PRENATAL VISIT NOTE  Subjective:  Laurie Rogers is a 31 y.o. G3P1011 at 34w4dbeing seen today for ongoing prenatal care.  She is currently monitored for the following issues for this low-risk pregnancy and has Supervision of high risk pregnancy, antepartum; History of pre-eclampsia in prior pregnancy, currently pregnant; Fibroadenoma; Benign neoplasm of breast; and H/O transfusion of packed red blood cells on their problem list.  Patient reports  nightly leg cramping .  Contractions: Irritability. Vag. Bleeding: None.  Movement: Present. Denies leaking of fluid.   The following portions of the patient's history were reviewed and updated as appropriate: allergies, current medications, past family history, past medical history, past social history, past surgical history and problem list.   Objective:   Vitals:   10/08/21 1433  BP: 118/81  Pulse: 99  Weight: 156 lb (70.8 kg)    Fetal Status: Fetal Heart Rate (bpm): 144 Fundal Height: 35 cm Movement: Present     General:  Alert, oriented and cooperative. Patient is in no acute distress.  Skin: Skin is warm and dry. No rash noted.   Cardiovascular: Normal heart rate noted  Respiratory: Normal respiratory effort, no problems with respiration noted  Abdomen: Soft, gravid, appropriate for gestational age.  Pain/Pressure: Present     Pelvic: Cervical exam deferred        Extremities: Normal range of motion.  Edema: Trace  Mental Status: Normal mood and affect. Normal behavior. Normal judgment and thought content.   Assessment and Plan:  Pregnancy: G3P1011 at 321w4d. Supervision of high risk pregnancy in third trimester - Doing well, feeling regular and vigorous fetal movement   2. [redacted] weeks gestation of pregnancy - Routine OB care including anticipatory guidance re GBS testing at next visit  3. History of pre-eclampsia in prior pregnancy, currently pregnant - BP stable, taking daily baby aspirin  4. Leg cramps in  pregnancy - Encouraged daily stretching and an electrolyte drink at bedtime - Magnesium Oxide -Mg Supplement (MAG-OXIDE) 200 MG TABS; Take 2 tablets (400 mg total) by mouth at bedtime. If that amount causes loose stools in the am, switch to '200mg'$  daily at bedtime.  Dispense: 60 tablet; Refill: 3  Preterm labor symptoms and general obstetric precautions including but not limited to vaginal bleeding, contractions, leaking of fluid and fetal movement were reviewed in detail with the patient. Please refer to After Visit Summary for other counseling recommendations.   Return in about 1 week (around 10/15/2021) for IN-PERSON, LOB/GBS.  Future Appointments  Date Time Provider Department Center  10/15/2021 11:15 AM WaHelaine ChessMPh[facility]MM[facility]9/20/2023  4:15 PM WaHelaine ChessMVa [facility] Center [facility] Tuscal[facility]osaM[facility]9/27/2023 10:15 AM WaGabriel CarinaCNM WME[facility]10/05/2021  1:15 PM WaHelaine ChessMReg[facility] HospitalMFranconiaspringfield [facility]10/12/2021  1:15 PM WaHelaine ChessMYakima [facility] AssocMCoord[facility]10/12/2021  2:15 PM WMC-WOCA NST WMC-CWH WMShore[place]  JaGabriel CarinaCNM

## 2021-10-15 ENCOUNTER — Other Ambulatory Visit (HOSPITAL_COMMUNITY)
Admission: RE | Admit: 2021-10-15 | Discharge: 2021-10-15 | Disposition: A | Discharge status: Home / Self Care | Payer: 59 | Source: Ambulatory Visit | Attending: Certified Nurse Midwife | Admitting: Certified Nurse Midwife

## 2021-10-15 ENCOUNTER — Other Ambulatory Visit: Payer: Self-pay

## 2021-10-15 ENCOUNTER — Ambulatory Visit (INDEPENDENT_AMBULATORY_CARE_PROVIDER_SITE_OTHER): Payer: 59 | Admitting: Certified Nurse Midwife

## 2021-10-15 VITALS — BP 113/68 | HR 93 | Wt 156.0 lb

## 2021-10-15 DIAGNOSIS — O0993 Supervision of high risk pregnancy, unspecified, third trimester: Secondary | ICD-10-CM | POA: Insufficient documentation

## 2021-10-15 DIAGNOSIS — Z3A36 36 weeks gestation of pregnancy: Secondary | ICD-10-CM

## 2021-10-15 DIAGNOSIS — O09299 Supervision of pregnancy with other poor reproductive or obstetric history, unspecified trimester: Secondary | ICD-10-CM

## 2021-10-16 ENCOUNTER — Encounter: Payer: Self-pay | Admitting: Certified Nurse Midwife

## 2021-10-16 LAB — CERVICOVAGINAL ANCILLARY ONLY
Bacterial Vaginitis (gardnerella): NEGATIVE
Candida Glabrata: NEGATIVE
Candida Vaginitis: NEGATIVE
Chlamydia: NEGATIVE
Comment: NEGATIVE
Comment: NEGATIVE
Comment: NEGATIVE
Comment: NEGATIVE
Comment: NEGATIVE
Comment: NORMAL
Neisseria Gonorrhea: NEGATIVE
Trichomonas: NEGATIVE

## 2021-10-17 NOTE — Progress Notes (Signed)
   PRENATAL VISIT NOTE  Subjective:  Laurie Rogers is a 31 y.o. G3P1011 at 46w6dbeing seen today for ongoing prenatal care.  She is currently monitored for the following issues for this high-risk pregnancy and has Supervision of high risk pregnancy, antepartum; History of pre-eclampsia in prior pregnancy, currently pregnant; Fibroadenoma; Benign neoplasm of breast; and H/O transfusion of packed red blood cells on their problem list.  Patient reports no complaints.  Contractions: Irritability. Vag. Bleeding: None.  Movement: Present. Denies leaking of fluid.   The following portions of the patient's history were reviewed and updated as appropriate: allergies, current medications, past family history, past medical history, past social history, past surgical history and problem list.   Objective:   Vitals:   10/15/21 1132  BP: 113/68  Pulse: 93  Weight: 156 lb (70.8 kg)    Fetal Status:     Movement: Present     General:  Alert, oriented and cooperative. Patient is in no acute distress.  Skin: Skin is warm and dry. No rash noted.   Cardiovascular: Normal heart rate noted  Respiratory: Normal respiratory effort, no problems with respiration noted  Abdomen: Soft, gravid, appropriate for gestational age.  Pain/Pressure: Present     Pelvic: Cervical exam deferred        Extremities: Normal range of motion.  Edema: Trace  Mental Status: Normal mood and affect. Normal behavior. Normal judgment and thought content.   Assessment and Plan:  Pregnancy: G3P1011 at 320w6d. Supervision of high risk pregnancy in third trimester - Doing well, feeling regular and vigorous fetal movement   2. [redacted] weeks gestation of pregnancy - Routine OB care  - Cervicovaginal ancillary only( [place]) - Culture, beta strep (group b only)  3. History of pre-eclampsia in prior pregnancy, currently pregnant - BP stable, no s/sx PEC  Preterm labor symptoms and general obstetric precautions including  but not limited to vaginal bleeding, contractions, leaking of fluid and fetal movement were reviewed in detail with the patient. Please refer to After Visit Summary for other counseling recommendations.   Return in about 1 week (around 10/22/2021) for IN-PERSON, LOB.  Future Appointments  Date Time Provider Department Center  10/22/2021  4:15 PM WaHelaine ChessMClin[facility] RosaMC[facility]9/27/2023 10:15 AM WaGabriel CarinaCNM WMElmore [facility] HospitalMMeredyth Surgery [facility]10/05/2021  1:15 PM WaHelaine ChessMS[facility] FallMOr[facility]10/12/2021  1:15 PM WaHelaine ChessMEncompass Health Harmarville Rehabilitation HospitalMS[facility]10/12/2021  2:15 PM WMC-WOCA NST WMC-CWH WMDevon  JaGabriel CarinaCNM

## 2021-10-19 ENCOUNTER — Encounter: Payer: Self-pay | Admitting: Radiology

## 2021-10-19 LAB — CULTURE, BETA STREP (GROUP B ONLY): Strep Gp B Culture: NEGATIVE

## 2021-10-22 ENCOUNTER — Other Ambulatory Visit: Payer: Self-pay

## 2021-10-22 ENCOUNTER — Ambulatory Visit (INDEPENDENT_AMBULATORY_CARE_PROVIDER_SITE_OTHER): Payer: 59 | Admitting: Certified Nurse Midwife

## 2021-10-22 VITALS — BP 128/80 | HR 101 | Wt 156.3 lb

## 2021-10-22 DIAGNOSIS — O0993 Supervision of high risk pregnancy, unspecified, third trimester: Secondary | ICD-10-CM

## 2021-10-22 DIAGNOSIS — Z3A37 37 weeks gestation of pregnancy: Secondary | ICD-10-CM

## 2021-10-22 DIAGNOSIS — G5603 Carpal tunnel syndrome, bilateral upper limbs: Secondary | ICD-10-CM

## 2021-10-22 DIAGNOSIS — O09293 Supervision of pregnancy with other poor reproductive or obstetric history, third trimester: Secondary | ICD-10-CM

## 2021-10-22 DIAGNOSIS — O09299 Supervision of pregnancy with other poor reproductive or obstetric history, unspecified trimester: Secondary | ICD-10-CM

## 2021-10-22 NOTE — Progress Notes (Signed)
   PRENATAL VISIT NOTE  Subjective:  Laurie Rogers is a 31 y.o. G3P1011 at 25w4dbeing seen today for ongoing prenatal care.  She is currently monitored for the following issues for this high-risk pregnancy and has Supervision of high risk pregnancy, antepartum; History of pre-eclampsia in prior pregnancy, currently pregnant; Fibroadenoma; Benign neoplasm of breast; and H/O transfusion of packed red blood cells on their problem list.  Patient reports carpal tunnel symptoms.  Contractions: Irritability. Vag. Bleeding: None.  Movement: Present. Denies leaking of fluid.   The following portions of the patient's history were reviewed and updated as appropriate: allergies, current medications, past family history, past medical history, past social history, past surgical history and problem list.   Objective:   Vitals:   10/22/21 1629  BP: 128/80  Pulse: (!) 101  Weight: 156 lb 4.8 oz (70.9 kg)    Fetal Status: Fetal Heart Rate (bpm): 169 Fundal Height: 37 cm Movement: Present  Presentation: Vertex  General:  Alert, oriented and cooperative. Patient is in no acute distress.  Skin: Skin is warm and dry. No rash noted.   Cardiovascular: Normal heart rate noted  Respiratory: Normal respiratory effort, no problems with respiration noted  Abdomen: Soft, gravid, appropriate for gestational age.  Pain/Pressure: Present     Pelvic: Cervical exam deferred        Extremities: Normal range of motion.  Edema: Trace  Mental Status: Normal mood and affect. Normal behavior. Normal judgment and thought content.   Assessment and Plan:  Pregnancy: G3P1011 at 325w4d. Supervision of high risk pregnancy in third trimester - Doing well, feeling regular and vigorous fetal movement   2. [redacted] weeks gestation of pregnancy - Routine OB care, GBS negative at last visit  3. History of pre-eclampsia in prior pregnancy, currently pregnant - BP stable, no s/sx PEC  4. Bilateral carpal tunnel syndrome -  Thumbs difficult to move at night, hands swelling - Demonstrated stretches to relieve and prevent pain, has magnesium to take at night already - Declined wrist splints  Term labor symptoms and general obstetric precautions including but not limited to vaginal bleeding, contractions, leaking of fluid and fetal movement were reviewed in detail with the patient. Please refer to After Visit Summary for other counseling recommendations.   Return in about 1 week (around 10/29/2021) for IN-PERSON, LOB.  Future Appointments  Date Time Provider DeMont[place]9/27/2023 10:15 AM WaHelaine ChessMMark [facility] ClinicMAss[facility]10/05/2021  1:15 PM WaHelaine ChessMFoothills HospitalML[facility]10/12/2021  1:15 PM WaHelaine ChessMSilver Cross Hospital And Medical CentersMGengastro LLC Dba [facility]ath10/12/2021  2:15 PM WMC-WOCA NST WMC-CWH WMRockfish  JaGabriel CarinaCNM

## 2021-10-27 ENCOUNTER — Encounter: Payer: Self-pay | Admitting: Certified Nurse Midwife

## 2021-10-29 ENCOUNTER — Other Ambulatory Visit: Payer: Self-pay

## 2021-10-29 ENCOUNTER — Ambulatory Visit (INDEPENDENT_AMBULATORY_CARE_PROVIDER_SITE_OTHER): Payer: 59 | Admitting: Certified Nurse Midwife

## 2021-10-29 VITALS — BP 114/72 | HR 105

## 2021-10-29 DIAGNOSIS — O0993 Supervision of high risk pregnancy, unspecified, third trimester: Secondary | ICD-10-CM

## 2021-10-29 DIAGNOSIS — O09299 Supervision of pregnancy with other poor reproductive or obstetric history, unspecified trimester: Secondary | ICD-10-CM

## 2021-10-29 DIAGNOSIS — Z3A38 38 weeks gestation of pregnancy: Secondary | ICD-10-CM

## 2021-10-30 NOTE — Progress Notes (Signed)
   PRENATAL VISIT NOTE  Subjective:  Laurie Rogers is a 31 y.o. G3P1011 at 9w5dbeing seen today for ongoing prenatal care.  She is currently monitored for the following issues for this high-risk pregnancy and has Supervision of high risk pregnancy, antepartum; History of pre-eclampsia in prior pregnancy, currently pregnant; Fibroadenoma; Benign neoplasm of breast; and H/O transfusion of packed red blood cells on their problem list.  Patient reports occasional contractions.  Contractions: Irregular. Vag. Bleeding: None.  Movement: Present. Denies leaking of fluid.   The following portions of the patient's history were reviewed and updated as appropriate: allergies, current medications, past family history, past medical history, past social history, past surgical history and problem list.   Objective:   Vitals:   10/29/21 1053  BP: 114/72  Pulse: (!) 105    Fetal Status: Fetal Heart Rate (bpm): 148 Fundal Height: 38 cm Movement: Present  Presentation: Vertex  General:  Alert, oriented and cooperative. Patient is in no acute distress.  Skin: Skin is warm and dry. No rash noted.   Cardiovascular: Normal heart rate noted  Respiratory: Normal respiratory effort, no problems with respiration noted  Abdomen: Soft, gravid, appropriate for gestational age.  Pain/Pressure: Present     Pelvic: Cervical exam deferred        Extremities: Normal range of motion.  Edema: Trace  Mental Status: Normal mood and affect. Normal behavior. Normal judgment and thought content.   Assessment and Plan:  Pregnancy: G3P1011 at 394w5d. Supervision of high risk pregnancy in third trimester - Doing well, feeling regular and vigorous fetal movement   2. [redacted] weeks gestation of pregnancy - Routine OB care - Feeling fatigued - reviewed ways to improve energy through these last couple of weeks. Also affirmed the need to be less social as she approaches labor. - Still desires to wait for labor, IOL at 41wks  if needed.  3. History of pre-eclampsia in prior pregnancy, currently pregnant - BP normal today but she does note increased swelling in her hands, the fatigue and intermittent, very mild headaches that go away without intervention. - Discussed ways to prevent increasing BP and preeclampsia including rest, light exercise, hydration, increased protein intake and stress modulation. - Strong PEC precautions given, discussed possibility of scheduling IOL if these symptoms continue to avoid developing gHTN or PEC.   Term labor symptoms and general obstetric precautions including but not limited to vaginal bleeding, contractions, leaking of fluid and fetal movement were reviewed in detail with the patient. Please refer to After Visit Summary for other counseling recommendations.   Return in about 1 week (around 11/05/2021) for IN-PERSON, LOB.  Future Appointments  Date Time Provider DeBlanchard10/05/2021  1:15 PM WaHelaine ChessMGarfield Medical CenterMJonathan [facility]10/12/2021  1:15 PM WaHelaine ChessMWest Park Surgery CenterMSouthwestern [facility] Center LLC10/12/2021  2:15 PM WMC-WOCA NST WMOlympia Multi Specialty Clinic Ambulatory Procedures Cntr PLLCMPine [place]  JaGabriel CarinaCNM

## 2021-11-05 ENCOUNTER — Ambulatory Visit (INDEPENDENT_AMBULATORY_CARE_PROVIDER_SITE_OTHER): Payer: 59 | Admitting: Certified Nurse Midwife

## 2021-11-05 ENCOUNTER — Other Ambulatory Visit: Payer: Self-pay

## 2021-11-05 VITALS — BP 123/76 | HR 91 | Wt 161.8 lb

## 2021-11-05 DIAGNOSIS — Z3A39 39 weeks gestation of pregnancy: Secondary | ICD-10-CM

## 2021-11-05 DIAGNOSIS — O09299 Supervision of pregnancy with other poor reproductive or obstetric history, unspecified trimester: Secondary | ICD-10-CM

## 2021-11-05 DIAGNOSIS — O09293 Supervision of pregnancy with other poor reproductive or obstetric history, third trimester: Secondary | ICD-10-CM

## 2021-11-05 DIAGNOSIS — O0993 Supervision of high risk pregnancy, unspecified, third trimester: Secondary | ICD-10-CM

## 2021-11-05 NOTE — Progress Notes (Signed)
   PRENATAL VISIT NOTE  Subjective:  Laurie Rogers is a 31 y.o. G3P1011 at 71w4dbeing seen today for ongoing prenatal care.  She is currently monitored for the following issues for this high-risk pregnancy and has Supervision of high risk pregnancy, antepartum; History of pre-eclampsia in prior pregnancy, currently pregnant; Fibroadenoma; Benign neoplasm of breast; and H/O transfusion of packed red blood cells on their problem list.  Patient reports  contractions last night, loose stools, feeling emotional and some nausea. Still feeling regular movement .  Contractions: Irregular. Vag. Bleeding: None.  Movement: Present. Denies leaking of fluid.   The following portions of the patient's history were reviewed and updated as appropriate: allergies, current medications, past family history, past medical history, past social history, past surgical history and problem list.   Objective:   Vitals:   11/05/21 1316  BP: 123/76  Pulse: 91  Weight: 161 lb 12.8 oz (73.4 kg)    Fetal Status: Fetal Heart Rate (bpm): 138 Fundal Height: 39 cm Movement: Present  Presentation: Vertex  General:  Alert, oriented and cooperative. Patient is in no acute distress.  Skin: Skin is warm and dry. No rash noted.   Cardiovascular: Normal heart rate noted  Respiratory: Normal respiratory effort, no problems with respiration noted  Abdomen: Soft, gravid, appropriate for gestational age.  Pain/Pressure: Present     Pelvic: Cervical exam deferred        Extremities: Normal range of motion.  Edema: Trace  Mental Status: Normal mood and affect. Normal behavior. Normal judgment and thought content.   Assessment and Plan:  Pregnancy: G3P1011 at 333w4d. Supervision of high risk pregnancy in third trimester - Doing well overall, feeling regular movement  2. [redacted] weeks gestation of pregnancy - Routine OB care - Reviewed signs of labor, symptoms are likely pre-labor - Encouraged to rest, walk and have sex.  NST/BPP needed at next visit  3. History of pre-eclampsia in prior pregnancy, currently pregnant - BP great today, no s/sx of PEC, still desires to wait until 41+wks - Taking daily low dose aspirin  Term labor symptoms and general obstetric precautions including but not limited to vaginal bleeding, contractions, leaking of fluid and fetal movement were reviewed in detail with the patient. Please refer to After Visit Summary for other counseling recommendations.   Return in about 1 week (around 11/12/2021) for LOB.  Future Appointments  Date Time Provider DeSilver Lakes10/12/2021  1:15 PM WaHelaine ChessMSchwab Rehabilitation CenterMNorth[facility]10/12/2021  2:15 PM WMC-WOCA NST WMWaco Gastroenterology Endoscopy CenterM[facility]  JaGabriel CarinaCNM

## 2021-11-12 ENCOUNTER — Other Ambulatory Visit: Payer: Self-pay

## 2021-11-12 ENCOUNTER — Ambulatory Visit: Payer: 59 | Admitting: *Deleted

## 2021-11-12 ENCOUNTER — Ambulatory Visit (INDEPENDENT_AMBULATORY_CARE_PROVIDER_SITE_OTHER): Payer: 59 | Admitting: Certified Nurse Midwife

## 2021-11-12 ENCOUNTER — Ambulatory Visit (INDEPENDENT_AMBULATORY_CARE_PROVIDER_SITE_OTHER): Payer: 59

## 2021-11-12 VITALS — BP 122/82 | HR 93 | Wt 161.1 lb

## 2021-11-12 DIAGNOSIS — O480 Post-term pregnancy: Secondary | ICD-10-CM

## 2021-11-12 DIAGNOSIS — Z3A40 40 weeks gestation of pregnancy: Secondary | ICD-10-CM

## 2021-11-12 DIAGNOSIS — O0993 Supervision of high risk pregnancy, unspecified, third trimester: Secondary | ICD-10-CM

## 2021-11-13 NOTE — Addendum Note (Signed)
Addended by: Gaylan Gerold on: 11/13/2021 09:31 AM   Modules accepted: Orders

## 2021-11-13 NOTE — Progress Notes (Addendum)
   PRENATAL VISIT NOTE  Subjective:  Laurie Rogers is a 31 y.o. G3P1011 at 5w5dbeing seen today for ongoing prenatal care.  She is currently monitored for the following issues for this low-risk pregnancy and has Supervision of high risk pregnancy, antepartum; History of pre-eclampsia in prior pregnancy, currently pregnant; Fibroadenoma; Benign neoplasm of breast; and H/O transfusion of packed red blood cells on their problem list.  Patient reports no complaints.  Contractions: Irritability. Vag. Bleeding: None.  Movement: Present. Denies leaking of fluid.   The following portions of the patient's history were reviewed and updated as appropriate: allergies, current medications, past family history, past medical history, past social history, past surgical history and problem list.   Objective:   Vitals:   11/12/21 1322  BP: 122/82  Pulse: 93  Weight: 161 lb 1.6 oz (73.1 kg)    Fetal Status: Fetal Heart Rate (bpm): 140 Fundal Height: 40 cm Movement: Present  Presentation: Vertex  General:  Alert, oriented and cooperative. Patient is in no acute distress.  Skin: Skin is warm and dry. No rash noted.   Cardiovascular: Normal heart rate noted  Respiratory: Normal respiratory effort, no problems with respiration noted  Abdomen: Soft, gravid, appropriate for gestational age.  Pain/Pressure: Present     Pelvic: Cervical exam deferred        Extremities: Normal range of motion.  Edema: Trace  Mental Status: Normal mood and affect. Normal behavior. Normal judgment and thought content.   Assessment and Plan:  Pregnancy: G3P1011 at 447w5d. Supervision of high risk pregnancy in third trimester - Doing well, feeling regular and vigorous fetal movement   2. [redacted] weeks gestation of pregnancy - Routine OB care  - Reviewed records from last labor/delivery. Pt expressed confusion about her diagnosis and treatment. Per notes - she presented at 41wks for DFM and was subsequently induced for  PEC w SF off proteinuria alone with normal BP and labs for the entirety of her labor/postpartum.  - BP has been normal this pregnancy and pt feeling better this week with improved hydration/eating habits. Copious encouragement given. - Pt recalls no fundal rubs until she got on to the postpartum unit AS she was having a major hemorrhage event. Explained that our standard is to perform fundal rubs immediately after delivery of placenta and then [id number] the first hour, then progressively less but regularly until discharge.  3. Post-term pregnancy, 40-42 weeks of gestation - Pt amenable to IOL closer to 42 weeks if BP remains stable. IOL form filled out, orders in. - NST today 10/10 with appropriate AFI  Term labor symptoms and general obstetric precautions including but not limited to vaginal bleeding, contractions, leaking of fluid and fetal movement were reviewed in detail with the patient. Please refer to After Visit Summary for other counseling recommendations.   Return in about 1 week (around 11/19/2021) for IN-PERSON, LOB, NST.  Future Appointments  Date Time Provider DepInchelium0/18/2023 11:15 AM WMC-WOCA NST WMCParkview Lagrange HospitalCCenter For Ambulatory Surgery LLCJamGabriel CarinaNM

## 2021-11-16 ENCOUNTER — Encounter: Payer: Self-pay | Admitting: Certified Nurse Midwife

## 2021-11-17 ENCOUNTER — Encounter (HOSPITAL_COMMUNITY): Payer: Self-pay | Admitting: Obstetrics and Gynecology

## 2021-11-17 ENCOUNTER — Inpatient Hospital Stay (HOSPITAL_COMMUNITY)
Admission: AD | Admit: 2021-11-17 | Discharge: 2021-11-19 | DRG: 807 | Disposition: A | Discharge status: Home / Self Care | Payer: 59 | Attending: Obstetrics and Gynecology | Admitting: Obstetrics and Gynecology

## 2021-11-17 ENCOUNTER — Other Ambulatory Visit: Payer: Self-pay

## 2021-11-17 DIAGNOSIS — O0993 Supervision of high risk pregnancy, unspecified, third trimester: Secondary | ICD-10-CM

## 2021-11-17 DIAGNOSIS — Z7982 Long term (current) use of aspirin: Secondary | ICD-10-CM | POA: Diagnosis not present

## 2021-11-17 DIAGNOSIS — O09299 Supervision of pregnancy with other poor reproductive or obstetric history, unspecified trimester: Secondary | ICD-10-CM

## 2021-11-17 DIAGNOSIS — Z3A41 41 weeks gestation of pregnancy: Secondary | ICD-10-CM | POA: Diagnosis not present

## 2021-11-17 DIAGNOSIS — O480 Post-term pregnancy: Secondary | ICD-10-CM | POA: Diagnosis not present

## 2021-11-17 LAB — CBC
HCT: 31.3 % — ABNORMAL LOW (ref 36.0–46.0)
Hemoglobin: 10.1 g/dL — ABNORMAL LOW (ref 12.0–15.0)
MCH: 25.1 pg — ABNORMAL LOW (ref 26.0–34.0)
MCHC: 32.3 g/dL (ref 30.0–36.0)
MCV: 77.9 fL — ABNORMAL LOW (ref 80.0–100.0)
Platelets: 254 10*3/uL (ref 150–400)
RBC: 4.02 MIL/uL (ref 3.87–5.11)
RDW: 17.5 % — ABNORMAL HIGH (ref 11.5–15.5)
WBC: 7.6 10*3/uL (ref 4.0–10.5)
nRBC: 0 % (ref 0.0–0.2)

## 2021-11-17 LAB — TYPE AND SCREEN
ABO/RH(D): B POS
Antibody Screen: NEGATIVE

## 2021-11-17 LAB — RPR: RPR Ser Ql: NONREACTIVE

## 2021-11-17 MED ORDER — CALCIUM CARBONATE ANTACID 500 MG PO CHEW
1.0000 | CHEWABLE_TABLET | Freq: Every day | ORAL | Status: DC
  Administered 2021-11-17: 200 mg via ORAL
  Filled 2021-11-17: qty 1

## 2021-11-17 MED ORDER — SODIUM CHLORIDE 0.9 % IV SOLN: 250.0000 mL | INTRAVENOUS | Status: DC | PRN

## 2021-11-17 MED ORDER — LACTATED RINGERS IV SOLN
500.0000 mL | INTRAVENOUS | Status: DC | PRN
  Administered 2021-11-17: 1000 mL via INTRAVENOUS

## 2021-11-17 MED ORDER — SODIUM CHLORIDE 0.9% FLUSH: 3.0000 mL | Freq: Two times a day (BID) | INTRAVENOUS | Status: DC

## 2021-11-17 MED ORDER — FLEET ENEMA 7-19 GM/118ML RE ENEM: 1.0000 | ENEMA | RECTAL | Status: DC | PRN

## 2021-11-17 MED ORDER — LACTATED RINGERS IV SOLN: INTRAVENOUS | Status: DC

## 2021-11-17 MED ORDER — LIDOCAINE HCL (PF) 1 % IJ SOLN: 30.0000 mL | INTRAMUSCULAR | Status: DC | PRN

## 2021-11-17 MED ORDER — MISOPROSTOL 50MCG HALF TABLET
50.0000 ug | ORAL_TABLET | Freq: Once | ORAL | Status: AC
  Administered 2021-11-17: 50 ug via ORAL
  Filled 2021-11-17: qty 1

## 2021-11-17 MED ORDER — OXYTOCIN-SODIUM CHLORIDE 30-0.9 UT/500ML-% IV SOLN
2.5000 [IU]/h | INTRAVENOUS | Status: DC
  Administered 2021-11-18: 2.5 [IU]/h via INTRAVENOUS
  Filled 2021-11-17: qty 500

## 2021-11-17 MED ORDER — FENTANYL CITRATE (PF) 100 MCG/2ML IJ SOLN
50.0000 ug | INTRAMUSCULAR | Status: DC | PRN
  Filled 2021-11-17: qty 2

## 2021-11-17 MED ORDER — OXYTOCIN BOLUS FROM INFUSION
333.0000 mL | Freq: Once | INTRAVENOUS | Status: AC
  Administered 2021-11-18: 333 mL via INTRAVENOUS

## 2021-11-17 MED ORDER — SOD CITRATE-CITRIC ACID 500-334 MG/5ML PO SOLN: 30.0000 mL | ORAL | Status: DC | PRN

## 2021-11-17 MED ORDER — TERBUTALINE SULFATE 1 MG/ML IJ SOLN: 0.2500 mg | Freq: Once | INTRAMUSCULAR | Status: DC | PRN

## 2021-11-17 MED ORDER — ACETAMINOPHEN 325 MG PO TABS
650.0000 mg | ORAL_TABLET | ORAL | Status: DC | PRN
  Administered 2021-11-18: 650 mg via ORAL
  Filled 2021-11-17: qty 2

## 2021-11-17 MED ORDER — ONDANSETRON HCL 4 MG/2ML IJ SOLN
4.0000 mg | Freq: Four times a day (QID) | INTRAMUSCULAR | Status: DC | PRN
  Administered 2021-11-18: 4 mg via INTRAVENOUS
  Filled 2021-11-17: qty 2

## 2021-11-17 MED ORDER — OXYTOCIN-SODIUM CHLORIDE 30-0.9 UT/500ML-% IV SOLN: 1.0000 m[IU]/min | INTRAVENOUS | Status: DC

## 2021-11-17 MED ORDER — MISOPROSTOL 25 MCG QUARTER TABLET
25.0000 ug | ORAL_TABLET | Freq: Once | ORAL | Status: AC
  Administered 2021-11-17: 25 ug via VAGINAL
  Filled 2021-11-17: qty 1

## 2021-11-17 MED ORDER — SODIUM CHLORIDE 0.9% FLUSH: 3.0000 mL | INTRAVENOUS | Status: DC | PRN

## 2021-11-17 NOTE — H&P (Signed)
Laurie Rogers is a 31 y.o. G61P1011 female at 69w2dby 12.2wk u/s presenting for q 139m ctx.   Reports active fetal movement, contractions: irregular, every 5-10 minutes, vaginal bleeding: none, membranes: intact.  [facility]   Most recent u/s : 3529w3dFW 74%, AFI 18cm, ceph, ant placenta.   This pregnancy complicated by: # none  Prenatal History/Complications:  # SVD [id number] pre-e  Past Medical History: Past Medical History:  Diagnosis Date   Fibroadenoma    Pre-eclampsia 2016    Past Surgical History: Past Surgical History:  Procedure Laterality Date   BREAST SURGERY     fibroadenoma removed    Obstetrical History: OB History     Gravida  3   Para  1   Term  1   Preterm      AB  1   Living  1      SAB  1   IAB      Ectopic      Multiple      Live Births  1           Social History: Social History   Socioeconomic History   Marital status: Married    Spouse name: Not on file   Number of children: Not on file   Years of education: Not on file   Highest education level: Not on file  Occupational History   Not on file  Tobacco Use   Smoking status: Never   Smokeless tobacco: Never  Vaping Use   Vaping Use: Never used  Substance and Sexual Activity   Alcohol use: Not Currently    Comment: occasionally   Drug use: Never   Sexual activity: Yes    Birth control/protection: Pill  Other Topics Concern   Not on file  Social History Narrative   Not on file   Social Determinants of Health   Financial Resource Strain: Not on file  Food Insecurity: No Food Insecurity (10/22/2021)   Hunger Vital Sign    Worried About Running Out of Food in the Last Year: Never true    Ran Out of Food in the Last Year: Never true  Transportation Needs: No Transportation Needs (10/22/2021)   PRAPARE - TraHydrologistedical): No    Lack of Transportation (Non-Medical): No  Physical  Activity: Not on file  Stress: Not on file  Social Connections: Not on file    Family History: Family History  Problem Relation Age of Onset   Diabetes Father    Diabetes Brother     Allergies: Allergies  Allergen Reactions   Oxycodone Hives, Other (See Comments) and Rash    Other reaction(s): Headache Hot flashes Hot flashes, night sweats, and mild rash (on arm)     Medications Prior to Admission  Medication Sig Dispense Refill Last Dose   aspirin EC 81 MG tablet Take 1 tablet (81 mg total) by mouth daily. Swallow whole. 90 tablet 2 Past Week   Prenatal MV & Min w/FA-DHA (PRENATAL ADULT GUMMY/DHA/FA PO) Take by mouth.   Past Week   Blood Pressure Monitoring (BLOOD PRESSURE KIT) DEVI 1 Device by Does not apply route as needed. 1 each 0    Magnesium Oxide -Mg Supplement (MAG-OXIDE) 200 MG TABS Take 2 tablets (400 mg total) by mouth at bedtime. If that amount causes loose stools in the am, switch to 200m17mily at bedtime. (Patient not taking: Reported on 10/15/2021)  60 tablet 3    ondansetron (ZOFRAN-ODT) 8 MG disintegrating tablet Take 1 tablet (8 mg total) by mouth every 8 (eight) hours as needed for nausea or vomiting. (Patient not taking: Reported on 10/15/2021) 5 tablet 0     Review of Systems  Pertinent pos/neg as indicated in HPI  Blood pressure 129/77, pulse 96, temperature 98.4 F (36.9 C), temperature source Oral, resp. rate 17, height 5' (1.524 m), weight 74.5 kg, last menstrual period 01/15/2021, SpO2 100 %. General appearance: alert, cooperative, and mild distress Lungs: clear to auscultation bilaterally Heart: regular rate and rhythm Abdomen: gravid, soft, non-tender, EFW by Leopold's approximately 7-8lbs Extremities: tr edema  Fetal monitoring: FHR: 150s bpm, variability: moderate,  Accelerations: Present,  decelerations:  Absent Uterine activity: irreg 5-21mns Dilation: 2.5 Effacement (%): 20 Station: -3 Exam by:: WHerb Grays RN Presentation:  cephalic   Prenatal labs: ABO, Rh: B/Positive/-- (04/04 1050) Antibody: Negative (04/04 1050) Rubella: 4.58 (04/04 1050) RPR: Non Reactive (07/10 0943)  HBsAg: Negative (04/04 1050)  HIV: Non Reactive (07/10 0943)  GBS: Negative/-- (09/13 1439)  2hr GTT: 80/147/141  Prenatal Transfer Tool  Maternal Diabetes: No Genetic Screening: Normal Maternal Ultrasounds/Referrals: Normal Fetal Ultrasounds or other Referrals:  None Maternal Substance Abuse:  No Significant Maternal Medications:  None Significant Maternal Lab Results: Group B Strep negative  No results found for this or any previous visit (from the past 24 hour(s)).   Assessment:  464w2dIUP  G3P1011  Latent labor  Cat 1 FHR  GBS Negative/-- (09/13 1439)  Plan:  -Admit to L&D  -Expectant management; would like a waterbirth> no current conditions to risk her out; will sign consent when she arrives on L&D  -Anticipate vag del   -Plans to breastfeed  -Contraception: POPs   KiMyrtis SerNM 11/17/2021, 6:02 AM

## 2021-11-17 NOTE — Progress Notes (Signed)
SHERRELLE PROCHAZKA MRN: [id number]  Subjective: -Care assumed of 31 y.o. G3P1011 at 28w2dwho presents for latent labor. In room to meet acquaintance of patient and family, husband, Tevin. Patient desires WB, but is currently holding in MAU d/t bed availability.   Objective: BP 113/73   Pulse 85   Temp 98.3 F (36.8 C) (Oral)   Resp 14   Ht 5' (1.524 m)   Wt 74.5 kg   LMP 01/15/2021   SpO2 99%   BMI 32.09 kg/m  No intake/output data recorded. No intake/output data recorded.  Fetal Monitoring: FHT: 135 bpm, Mod Var, -Decels, +Accels UC: Q121m    Physical Exam: General appearance: alert, well appearing, and in no distress. Chest: normal rate and regular rhythm.  clear to auscultation, no wheezes, rales or rhonchi, symmetric air entry. Abdominal exam: Soft, NT. Extremities: No edema Skin exam: Warm Dry  Vaginal Exam: SVE:   Dilation: 2.5 Effacement (%): 20 Station: -3 Exam by:: WhHerb GraysRN Membranes:Intact Internal Monitors: None  Augmentation/Induction: Pitocin:None Cytotec: None  Assessment:  IUP at 41.2 weeks Cat I FT  Latent Labor GBS Negative  Plan: -Transfer to labor as soon as room available. -Cautioned that patient's in active labor will take priority. -Further cautioned that WB is desired by multiple patient's already on labor floor and tub may not be available upon her arrival. -Informed that still candidate for WB. -Plan to sign consent once on labor floor.  -Patient verbalizes understanding, No Q/C.  -Continue other mgmt as ordered  JeRiley ChurchesCNM 11/17/2021, 9:35 AM

## 2021-11-17 NOTE — Progress Notes (Signed)
Patient Vitals for the past 4 hrs:  BP Temp Temp src Pulse Resp  11/17/21 2106 115/69 -- -- 85 --  11/17/21 2027 129/77 -- -- 88 --  11/17/21 1930 112/64 -- -- 79 18  11/17/21 1849 (!) 111/57 -- -- 85 --  11/17/21 1749 107/60 -- -- 74 17  11/17/21 1740 -- 98.9 F (37.2 C) Oral -- --   Ctx are "definitely getting much stronger"  Prefers to avoid pitocin. Cx 4/50/ballottable.  FHR Cat 1.  Instructed pt to let us know if ctx start to space out, lighten out, then consider AROM  Pt agreeable.

## 2021-11-17 NOTE — Progress Notes (Signed)
Patient ID: Laurie Rogers, female   DOB: 09-10-1990, 31 y.o.   MRN: [id number]  Subjective: -Patient resting in bed.  Reports that she is having frequent contractions in the lower abdominal area.  She endorses felt movement and denies vaginal bleeding or leaking.  She does report pressure that started upon rest.   Objective: BP 123/81   Pulse 77   Temp 98.1 F (36.7 C) (Oral)   Resp 17   Ht 5' (1.524 m)   Wt 74.5 kg   LMP 01/15/2021   SpO2 99%   BMI 32.09 kg/m  No intake/output data recorded. No intake/output data recorded.  Fetal Monitoring: FHT: 170 bpm by doppler UC: Palpates mild to moderate    Vaginal Exam: SVE:   Dilation: 2 Effacement (%): Thick Station: Ballotable Exam by:: Tomma Rakers RN Membranes:Intact Internal Monitors: None  Augmentation/Induction: Pitocin:None Cytotec: S/p 2nd Dose  Assessment:  IUP at 41.2 weeks Labor Augmentation  Plan: -Discussed monitoring and reassessing cervix after getting baseline for contraction pattern. Patient agreeable. -Initial doppler of baby with some tachycardia. Discussed IV bolus and patient agreeable. -Give 558m now and then saline lock. -Plan to assess and check cervix if contractions prove to be inadequate.  -Continue other mgmt as ordered   JRiley Churches CCollege ParkProvider, [facility] [place]10/16/2023, 5:33 PM

## 2021-11-17 NOTE — MAU Note (Signed)
.  Laurie Rogers is a 31 y.o. at 74w2dhere in MAU reporting: ctx that started around 0100 and are now 10 minutes apart. She reports lower back pain (7/10) and ctx (8/10). Denies VB or LOF. Reports good FM. LMP: N/A Onset of complaint: Today Pain score: see notes Vitals:   11/17/21 0440  BP: 135/84  Pulse: 86  Resp: 17  Temp: 98.4 F (36.9 C)  SpO2: 100%     FHT:150 Lab orders placed from triage:  MAU labor

## 2021-11-18 ENCOUNTER — Inpatient Hospital Stay (HOSPITAL_COMMUNITY): Payer: 59 | Admitting: Anesthesiology

## 2021-11-18 ENCOUNTER — Encounter (HOSPITAL_COMMUNITY): Payer: Self-pay | Admitting: Family Medicine

## 2021-11-18 DIAGNOSIS — O480 Post-term pregnancy: Secondary | ICD-10-CM

## 2021-11-18 DIAGNOSIS — Z3A41 41 weeks gestation of pregnancy: Secondary | ICD-10-CM

## 2021-11-18 MED ORDER — PRENATAL MULTIVITAMIN CH
1.0000 | ORAL_TABLET | Freq: Every day | ORAL | Status: DC
  Administered 2021-11-18 – 2021-11-19 (×2): 1 via ORAL
  Filled 2021-11-18 (×2): qty 1

## 2021-11-18 MED ORDER — FERROUS SULFATE 325 (65 FE) MG PO TABS
325.0000 mg | ORAL_TABLET | ORAL | Status: DC
  Administered 2021-11-18: 325 mg via ORAL
  Filled 2021-11-18: qty 1

## 2021-11-18 MED ORDER — PHENYLEPHRINE 80 MCG/ML (10ML) SYRINGE FOR IV PUSH (FOR BLOOD PRESSURE SUPPORT)
80.0000 ug | PREFILLED_SYRINGE | INTRAVENOUS | Status: DC | PRN
  Filled 2021-11-18: qty 10

## 2021-11-18 MED ORDER — ONDANSETRON HCL 4 MG PO TABS: 4.0000 mg | ORAL_TABLET | ORAL | Status: DC | PRN

## 2021-11-18 MED ORDER — DIBUCAINE (PERIANAL) 1 % EX OINT: 1.0000 | TOPICAL_OINTMENT | CUTANEOUS | Status: DC | PRN

## 2021-11-18 MED ORDER — SENNOSIDES-DOCUSATE SODIUM 8.6-50 MG PO TABS
2.0000 | ORAL_TABLET | ORAL | Status: DC
  Administered 2021-11-18 – 2021-11-19 (×2): 2 via ORAL
  Filled 2021-11-18 (×2): qty 2

## 2021-11-18 MED ORDER — MEASLES, MUMPS & RUBELLA VAC IJ SOLR: 0.5000 mL | Freq: Once | INTRAMUSCULAR | Status: DC

## 2021-11-18 MED ORDER — ACETAMINOPHEN 325 MG PO TABS: 650.0000 mg | ORAL_TABLET | ORAL | Status: DC | PRN

## 2021-11-18 MED ORDER — SIMETHICONE 80 MG PO CHEW: 80.0000 mg | CHEWABLE_TABLET | ORAL | Status: DC | PRN

## 2021-11-18 MED ORDER — MEDROXYPROGESTERONE ACETATE 150 MG/ML IM SUSP: 150.0000 mg | INTRAMUSCULAR | Status: DC | PRN

## 2021-11-18 MED ORDER — FENTANYL-BUPIVACAINE-NACL 0.5-0.125-0.9 MG/250ML-% EP SOLN
12.0000 mL/h | EPIDURAL | Status: DC | PRN
  Administered 2021-11-18: 12 mL/h via EPIDURAL
  Filled 2021-11-18: qty 250

## 2021-11-18 MED ORDER — EPHEDRINE 5 MG/ML INJ: 10.0000 mg | INTRAVENOUS | Status: DC | PRN

## 2021-11-18 MED ORDER — DIPHENHYDRAMINE HCL 50 MG/ML IJ SOLN: 12.5000 mg | INTRAMUSCULAR | Status: DC | PRN

## 2021-11-18 MED ORDER — TETANUS-DIPHTH-ACELL PERTUSSIS 5-2.5-18.5 LF-MCG/0.5 IM SUSY: 0.5000 mL | PREFILLED_SYRINGE | Freq: Once | INTRAMUSCULAR | Status: DC

## 2021-11-18 MED ORDER — LIDOCAINE-EPINEPHRINE (PF) 2 %-1:200000 IJ SOLN
INTRAMUSCULAR | Status: DC | PRN
  Administered 2021-11-18: 5 mL via EPIDURAL

## 2021-11-18 MED ORDER — FLEET ENEMA 7-19 GM/118ML RE ENEM: 1.0000 | ENEMA | Freq: Every day | RECTAL | Status: DC | PRN

## 2021-11-18 MED ORDER — ONDANSETRON HCL 4 MG/2ML IJ SOLN: 4.0000 mg | INTRAMUSCULAR | Status: DC | PRN

## 2021-11-18 MED ORDER — COCONUT OIL OIL: 1.0000 | TOPICAL_OIL | Status: DC | PRN

## 2021-11-18 MED ORDER — LACTATED RINGERS IV SOLN: 500.0000 mL | Freq: Once | INTRAVENOUS | Status: DC

## 2021-11-18 MED ORDER — TRANEXAMIC ACID-NACL 1000-0.7 MG/100ML-% IV SOLN: 1000.0000 mg | Freq: Once | INTRAVENOUS | Status: DC

## 2021-11-18 MED ORDER — BENZOCAINE-MENTHOL 20-0.5 % EX AERO
1.0000 | INHALATION_SPRAY | CUTANEOUS | Status: DC | PRN
  Administered 2021-11-18: 1 via TOPICAL
  Filled 2021-11-18: qty 56

## 2021-11-18 MED ORDER — TRANEXAMIC ACID-NACL 1000-0.7 MG/100ML-% IV SOLN
INTRAVENOUS | Status: AC
  Filled 2021-11-18: qty 100

## 2021-11-18 MED ORDER — IBUPROFEN 600 MG PO TABS
600.0000 mg | ORAL_TABLET | Freq: Four times a day (QID) | ORAL | Status: DC
  Administered 2021-11-18 – 2021-11-19 (×6): 600 mg via ORAL
  Filled 2021-11-18 (×7): qty 1

## 2021-11-18 MED ORDER — WITCH HAZEL-GLYCERIN EX PADS
1.0000 | MEDICATED_PAD | CUTANEOUS | Status: DC | PRN
  Administered 2021-11-18: 1 via TOPICAL

## 2021-11-18 MED ORDER — PHENYLEPHRINE 80 MCG/ML (10ML) SYRINGE FOR IV PUSH (FOR BLOOD PRESSURE SUPPORT): 80.0000 ug | PREFILLED_SYRINGE | INTRAVENOUS | Status: DC | PRN

## 2021-11-18 MED ORDER — METHYLERGONOVINE MALEATE 0.2 MG PO TABS: 0.2000 mg | ORAL_TABLET | ORAL | Status: DC | PRN

## 2021-11-18 MED ORDER — BISACODYL 10 MG RE SUPP: 10.0000 mg | Freq: Every day | RECTAL | Status: DC | PRN

## 2021-11-18 MED ORDER — METHYLERGONOVINE MALEATE 0.2 MG/ML IJ SOLN: 0.2000 mg | INTRAMUSCULAR | Status: DC | PRN

## 2021-11-18 MED ORDER — DIPHENHYDRAMINE HCL 25 MG PO CAPS: 25.0000 mg | ORAL_CAPSULE | Freq: Four times a day (QID) | ORAL | Status: DC | PRN

## 2021-11-18 NOTE — Lactation Note (Signed)
This note was copied from a baby's chart. Lactation Consultation Note  Patient Name: Laurie Rogers [id number] Date: 11/18/2021 Reason for consult: Initial assessment;Term;Primapara;1st time breastfeeding Age:30 hours   P1: Term infant at 41+3 weeks Feeding preference; Breast  "Laurie Rogers" was asleep STS on birth parent's chest when I arrived. Baby has been feeding often; parent denies pain with latching.  Reviewed breast feeding basics.  Encouraged to feed 8-12 times/24 hours or sooner if "Laurie Rogers" shows cues.  Suggested she call her RN/LC for latch assistance as needed. Baby has voided/stooled since birth.  Support person at bedside.  Parents had no questions at this time.   Maternal Data Has patient been taught Hand Expression?: Yes Does the patient have breastfeeding experience prior to this delivery?: No  Feeding Mother's Current Feeding Choice: Breast Milk  LATCH Score                    Lactation Tools Discussed/Used    Interventions Interventions: Breast feeding basics reviewed;Education;LC Services brochure  Discharge Pump: Personal  Consult Status Consult Status: Follow-up Date: 11/19/21 Follow-up type: In-patient    Little Ishikawa 11/18/2021, 3:36 PM

## 2021-11-18 NOTE — Discharge Summary (Signed)
Postpartum Discharge Summary  Date of Service updated***     Patient Name: Laurie Rogers DOB: May 09, 1990 MRN: [id number]  Date of admission: 11/17/2021 Delivery date:11/18/2021  Delivering provider: Christin Fudge  Date of discharge: 11/18/2021  Admitting diagnosis: Indication for care in labor or delivery [O75.9] Intrauterine pregnancy: [redacted]w[redacted]d    Secondary diagnosis:  Principal Problem:   Indication for care in labor or delivery Active Problems:   History of pre-eclampsia in prior pregnancy, currently pregnant  Additional problems: none    Discharge diagnosis: Term Pregnancy Delivered                                              Post partum procedures: Augmentation: Cytotec Complications: None  Hospital course: Onset of Labor With Vaginal Delivery      31y.o. yo G3P1011 at 441w3das admitted in Latent Labor on 11/17/2021. Labor course was complicated by nothing, augmented w/1 dose of cytotec 50/25.   Membrane Rupture Time/Date: 1:50 AM ,11/18/2021   Delivery Method:Vaginal, Spontaneous  Episiotomy: None  Lacerations:  1st degree  Patient had a postpartum course complicated by ***.  She is ambulating, tolerating a regular diet, passing flatus, and urinating well. Patient is discharged home in stable condition on 11/18/21.  Newborn Data: Birth date:11/18/2021  Birth time:2:34 AM  Gender:Female  Living status:Living  Apgars:9 ,9  Weight:   Magnesium Sulfate received: No BMZ received: No Rhophylac:N/A MMR:N/A T-DaP: declined Flu: No Transfusion:No  Physical exam  Vitals:   11/18/21 0130 11/18/21 0135 11/18/21 0140 11/18/21 0145  BP: 128/85 (!) 131/113 (!) 122/106 (!) 130/96  Pulse: 77 79 64 (!) 103  Resp:      Temp:      TempSrc:      SpO2: 100% 100% 98% 98%  Weight:      Height:       General: {Exam; general:21111117} Lochia: {Desc; appropriate/inappropriate:30686::"appropriate"} Uterine Fundus: {Desc; firm/soft:30687} Incision:  {Exam; incision:21111123} DVT Evaluation: {Exam; dvt:2111122} Labs: Lab Results  Component Value Date   WBC 7.6 11/17/2021   HGB 10.1 (L) 11/17/2021   HCT 31.3 (L) 11/17/2021   MCV 77.9 (L) 11/17/2021   PLT 254 11/17/2021      Latest Ref Rng & Units 04/23/2021   11:02 PM  CMP  Glucose 70 - 99 mg/dL 102   BUN 6 - 20 mg/dL 10   Creatinine 0.44 - 1.00 mg/dL 0.62   Sodium 135 - 145 mmol/L 134   Potassium 3.5 - 5.1 mmol/L 3.5   Chloride 98 - 111 mmol/L 104   CO2 22 - 32 mmol/L 22   Calcium 8.9 - 10.3 mg/dL 8.9   Total Protein 6.5 - 8.1 g/dL 6.2   Total Bilirubin 0.3 - 1.2 mg/dL 0.3   Alkaline Phos 38 - 126 U/L 42   AST 15 - 41 U/L 17   ALT 0 - 44 U/L 21    Edinburgh Score:     No data to display           After visit meds:  Allergies as of 11/18/2021       Reactions   Oxycodone Hives, Other (See Comments), Rash   Other reaction(s): Headache Hot flashes Hot flashes, night sweats, and mild rash (on arm)     [facility]  Discharge home in stable condition Infant Feeding: Breast Infant Disposition:home with mother Discharge instruction: per After Visit Summary and Postpartum booklet. Activity: Advance as tolerated. Pelvic rest for 6 weeks.  Diet: routine diet Future Appointments: Future Appointments  Date Time Provider [place]  11/19/2021 10:15 AM [facility]  11/19/2021 11:15 AM [facility]   Follow up Visit:  Sent to [facility] by delivering provider Please schedule this patient for a In person postpartum visit in 4 weeks with the following provider: Any provider. Additional Postpartum F/U:  Low risk pregnancy complicated by:  Delivery mode:  Vaginal, Spontaneous  Anticipated Birth Control:  POPs  Note routed to schedule 1wk pp bp check d/t few elevated intrapartum bp's w/ h/o pre-e  11/18/2021 Christin Fudge, CNM

## 2021-11-18 NOTE — Progress Notes (Signed)
Postpartum Day 1 Subjective: Doing well, no complaints, up ad lib, voiding, tolerating PO, and + flatus. Got some rest this morning and baby is latching well/voiding often.  Objective: Blood pressure 123/79, pulse 76, temperature 98.8 F (37.1 C), temperature source Oral, resp. rate 16, height 5' (1.524 m), weight 164 lb 4.8 oz (74.5 kg), last menstrual period 01/15/2021, SpO2 100 %, unknown if currently breastfeeding.  Physical Exam:  General: alert, cooperative, appears stated age, and no distress Lochia: appropriate Uterine Fundus: firm Incision: N/A DVT Evaluation: No evidence of DVT seen on physical exam.  Recent Labs    11/17/21 0700  HGB 10.1*  HCT 31.3*    Assessment/Plan: Plan for discharge tomorrow, Breastfeeding, and Contraception POPs   LOS: 1 day   Gabriel Carina, CNM 11/18/2021, 11:02 AM

## 2021-11-18 NOTE — Progress Notes (Signed)
Patient Vitals for the past 4 hrs:  BP Pulse Resp  11/17/21 2259 104/60 82 20  11/17/21 2213 116/61 77 --  11/17/21 2106 115/69 85 --  11/17/21 2027 129/77 88 --  Thinking about getting an epidural.  Wants to try IV meds first.  Cx 4.5/70/-2 per RN check an hour ago. Ctx mod, q 2-3 mintues.  FHR Cat 1.  Will try IV fentanyl.

## 2021-11-18 NOTE — Anesthesia Procedure Notes (Signed)
Epidural Patient location during procedure: OB Start time: 11/18/2021 1:10 AM End time: 11/18/2021 1:20 AM  Staffing Anesthesiologist: Freddrick March, MD Performed: anesthesiologist   Preanesthetic Checklist Completed: patient identified, IV checked, risks and benefits discussed, monitors and equipment checked, pre-op evaluation and timeout performed  Epidural Patient position: sitting Prep: DuraPrep and site prepped and draped Patient monitoring: continuous pulse ox, blood pressure, heart rate and cardiac monitor Approach: midline Location: L3-L4 Injection technique: LOR air  Needle:  Needle type: Tuohy  Needle gauge: 17 G Needle length: 9 cm Needle insertion depth: 5 cm Catheter type: closed end flexible Catheter size: 19 Gauge Catheter at skin depth: 10 cm Test dose: negative  Assessment Sensory level: T8 Events: blood not aspirated, injection not painful, no injection resistance, no paresthesia and negative IV test  Additional Notes Patient identified. Risks/Benefits/Options discussed with patient including but not limited to bleeding, infection, nerve damage, paralysis, failed block, incomplete pain control, headache, blood pressure changes, nausea, vomiting, reactions to medication both or allergic, itching and postpartum back pain. Confirmed with bedside nurse the patient's most recent platelet count. Confirmed with patient that they are not currently taking any anticoagulation, have any bleeding history or any family history of bleeding disorders. Patient expressed understanding and wished to proceed. All questions were answered. Sterile technique was used throughout the entire procedure. Please see nursing notes for vital signs. Test dose was given through epidural catheter and negative prior to continuing to dose epidural or start infusion. Warning signs of high block given to the patient including shortness of breath, tingling/numbness in hands, complete motor block,  or any concerning symptoms with instructions to call for help. Patient was given instructions on fall risk and not to get out of bed. All questions and concerns addressed with instructions to call with any issues or inadequate analgesia.  Reason for block:procedure for pain

## 2021-11-18 NOTE — Anesthesia Postprocedure Evaluation (Signed)
Anesthesia Post Note  Patient: Laurie Rogers  Procedure(s) Performed: AN AD HOC LABOR EPIDURAL     Patient location during evaluation: Mother Baby Anesthesia Type: Epidural Level of consciousness: awake and alert Pain management: pain level controlled Vital Signs Assessment: post-procedure vital signs reviewed and stable Respiratory status: spontaneous breathing, nonlabored ventilation and respiratory function stable Cardiovascular status: stable Postop Assessment: no headache, no backache and epidural receding Anesthetic complications: no   No notable events documented.  Last Vitals:  Vitals:   11/18/21 0439 11/18/21 0545  BP: 120/73 123/79  Pulse: 82 76  Resp: 16 16  Temp: 37 C 37.1 C  SpO2: 100% 100%    Last Pain:  Vitals:   11/18/21 0654  TempSrc:   PainSc: 0-No pain   Pain Goal:                   [employer]

## 2021-11-18 NOTE — Anesthesia Preprocedure Evaluation (Addendum)
Anesthesia Evaluation  Patient identified by MRN, date of birth, ID band Patient awake    Reviewed: Allergy & Precautions, NPO status , Patient's Chart, lab work & pertinent test results  Airway Mallampati: II  TM Distance: >3 FB Neck ROM: Full    Dental no notable dental hx.    Pulmonary neg pulmonary ROS,    Pulmonary exam normal breath sounds clear to auscultation       Cardiovascular negative cardio ROS Normal cardiovascular exam Rhythm:Regular Rate:Normal     Neuro/Psych negative neurological ROS  negative psych ROS   GI/Hepatic negative GI ROS, Neg liver ROS,   Endo/Other  negative endocrine ROS  Renal/GU negative Renal ROS  negative genitourinary   Musculoskeletal negative musculoskeletal ROS (+)   Abdominal   Peds  Hematology negative hematology ROS (+)   Anesthesia Other Findings Presents in labor  Reproductive/Obstetrics (+) Pregnancy                            Anesthesia Physical Anesthesia Plan  ASA: 2  Anesthesia Plan: Epidural   Post-op Pain Management:    Induction:   PONV Risk Score and Plan: Treatment may vary due to age or medical condition  Airway Management Planned: Natural Airway  Additional Equipment:   Intra-op Plan:   Post-operative Plan:   Informed Consent: I have reviewed the patients History and Physical, chart, labs and discussed the procedure including the risks, benefits and alternatives for the proposed anesthesia with the patient or authorized representative who has indicated his/her understanding and acceptance.       Plan Discussed with: Anesthesiologist  Anesthesia Plan Comments: (Patient identified. Risks, benefits, options discussed with patient including but not limited to bleeding, infection, nerve damage, paralysis, failed block, incomplete pain control, headache, blood pressure changes, nausea, vomiting, reactions to medication,  itching, and post partum back pain. Confirmed with bedside nurse the patient's most recent platelet count. Confirmed with the patient that they are not taking any anticoagulation, have any bleeding history or any family history of bleeding disorders. Patient expressed understanding and wishes to proceed. All questions were answered. )        Anesthesia Quick Evaluation

## 2021-11-18 NOTE — Anesthesia Postprocedure Evaluation (Signed)
Anesthesia Post Note  Patient: Laurie Rogers  Procedure(s) Performed: AN AD HOC LABOR EPIDURAL     Patient location during evaluation: Mother Baby Anesthesia Type: Epidural Level of consciousness: awake and alert Pain management: pain level controlled Vital Signs Assessment: post-procedure vital signs reviewed and stable Respiratory status: spontaneous breathing, nonlabored ventilation and respiratory function stable Cardiovascular status: stable Postop Assessment: no headache, no backache and epidural receding Anesthetic complications: no   No notable events documented.  Last Vitals:  Vitals:   11/18/21 0439 11/18/21 0545  BP: 120/73 123/79  Pulse: 82 76  Resp: 16 16  Temp: 37 C 37.1 C  SpO2: 100% 100%    Last Pain:  Vitals:   11/18/21 0654  TempSrc:   PainSc: 0-No pain   Pain Goal:                   [name]

## 2021-11-19 ENCOUNTER — Other Ambulatory Visit: Payer: Self-pay

## 2021-11-19 ENCOUNTER — Other Ambulatory Visit: Payer: 59

## 2021-11-19 ENCOUNTER — Encounter: Payer: 59 | Admitting: Certified Nurse Midwife

## 2021-11-19 MED ORDER — IBUPROFEN 600 MG PO TABS
600.0000 mg | ORAL_TABLET | Freq: Four times a day (QID) | ORAL | 0 refills | Status: AC | PRN
  Filled 2021-11-19: qty 30, 8d supply, fill #0

## 2021-11-19 NOTE — Discharge Instructions (Signed)
NO SEX UNTIL AFTER YOU GET YOUR BIRTH CONTROL  

## 2021-11-19 NOTE — Lactation Note (Signed)
Lactation Consultation Note  Patient Name: Laurie Rogers MVHQI'[id number] Date: 11/19/2021   Age:31 y.o. Experienced BF mom declines lactation. Maternal Data    Feeding    LATCH Score                    Lactation Tools Discussed/Used    Interventions    Discharge    Consult Status Consult Status: Complete    [name] 11/19/2021, 1:38 AM

## 2021-11-20 ENCOUNTER — Encounter: Payer: Self-pay | Admitting: Certified Nurse Midwife

## 2021-11-21 ENCOUNTER — Encounter: Payer: Self-pay | Admitting: Certified Nurse Midwife

## 2021-11-24 DIAGNOSIS — R2233 Localized swelling, mass and lump, upper limb, bilateral: Secondary | ICD-10-CM | POA: Diagnosis not present

## 2021-11-27 ENCOUNTER — Other Ambulatory Visit: Payer: Self-pay

## 2021-11-27 ENCOUNTER — Ambulatory Visit (INDEPENDENT_AMBULATORY_CARE_PROVIDER_SITE_OTHER): Payer: 59

## 2021-11-27 VITALS — BP 128/80 | HR 101

## 2021-11-27 DIAGNOSIS — Z0130 Encounter for examination of blood pressure without abnormal findings: Secondary | ICD-10-CM

## 2021-11-27 NOTE — Progress Notes (Signed)
Blood Pressure Check Visit  Laurie Rogers is here for blood pressure check following spontaneous vaginal on 11/18/2021. BP today is 128/80. Patient endorses a headache, denies dizziness, vision changes, swelling, anxiety, shortness of breath. Reviewed with D. Moshe Cipro, CNM.  Darlyne Russian, RN 11/27/2021  2:25 PM

## 2021-11-27 NOTE — Addendum Note (Signed)
Addended by: Madalyn Rob D on: 11/27/2021 02:39 PM   Modules accepted: Level of Service

## 2021-12-04 DIAGNOSIS — N6011 Diffuse cystic mastopathy of right breast: Secondary | ICD-10-CM | POA: Diagnosis not present

## 2021-12-04 DIAGNOSIS — Q838 Other congenital malformations of breast: Secondary | ICD-10-CM | POA: Diagnosis not present

## 2021-12-04 DIAGNOSIS — R2233 Localized swelling, mass and lump, upper limb, bilateral: Secondary | ICD-10-CM | POA: Diagnosis not present

## 2021-12-04 DIAGNOSIS — N6012 Diffuse cystic mastopathy of left breast: Secondary | ICD-10-CM | POA: Diagnosis not present

## 2021-12-17 ENCOUNTER — Encounter: Payer: Self-pay | Admitting: Certified Nurse Midwife

## 2021-12-17 ENCOUNTER — Ambulatory Visit (INDEPENDENT_AMBULATORY_CARE_PROVIDER_SITE_OTHER): Payer: 59 | Admitting: Certified Nurse Midwife

## 2021-12-17 ENCOUNTER — Other Ambulatory Visit: Payer: Self-pay

## 2021-12-17 ENCOUNTER — Other Ambulatory Visit (HOSPITAL_COMMUNITY)
Admission: RE | Admit: 2021-12-17 | Discharge: 2021-12-17 | Disposition: A | Discharge status: Home / Self Care | Payer: 59 | Source: Ambulatory Visit | Attending: Certified Nurse Midwife | Admitting: Certified Nurse Midwife

## 2021-12-17 DIAGNOSIS — N898 Other specified noninflammatory disorders of vagina: Secondary | ICD-10-CM

## 2021-12-17 DIAGNOSIS — Z3009 Encounter for other general counseling and advice on contraception: Secondary | ICD-10-CM

## 2021-12-17 NOTE — Progress Notes (Unsigned)
    [facility] Partum Visit Note  Laurie Rogers is a 31 y.o. [id number] female who presents for a postpartum visit. She is 4 weeks postpartum following a normal spontaneous vaginal delivery.  I have fully reviewed the prenatal and intrapartum course. The delivery was at 71w3dgestational weeks.  Anesthesia: epidural. Postpartum course has been uncomplicated other than a fishy odor that has gotten progressively better. Baby is doing well. Baby is feeding by breast. Bleeding thin lochia and changing a panty liner 10 times a day. Bowel function is normal. Bladder function is normal. Patient is not sexually active. Contraception method is oral progesterone-only contraceptive. Postpartum depression screening: negative.  Health Maintenance Due  Topic Date Due   COVID-19 Vaccine (3 - Pfizer risk series) 10/19/2019   INFLUENZA VACCINE  09/02/2021   The following portions of the patient's history were reviewed and updated as appropriate: allergies, current medications, past family history, past medical history, past social history, past surgical history, and problem list.  Review of Systems Pertinent items noted in HPI and remainder of comprehensive ROS otherwise negative.  Objective:  BP 109/77   Pulse 71   Wt 139 lb 11.2 oz (63.4 kg)   LMP 01/15/2021   Breastfeeding Yes   BMI 27.28 kg/m    Constitutional: Alert, oriented female in no physical distress.  HEENT: PERRLA Skin: normal color and turgor, no rash Cardiovascular: normal rate & rhythm, warm and well perfused Respiratory: normal effort, no problems with respiration noted GI: Abd soft, non-tender MS: Extremities nontender, no edema, normal ROM Neurologic: Alert and oriented x 4.  GU: no CVA tenderness Pelvic: exam deferred Breasts: normal lactating breasts  Assessment:  1. Postpartum care and examination - Doing well - swab collected to check for BV.  Plan:   Essential components of care per ACOG recommendations:  1.  Mood  and well being: Patient with negative depression screening today. Reviewed local resources for support.  - Patient tobacco use? No.   - hx of drug use? No.    2. Infant care and feeding:  -Patient currently breastmilk feeding? Yes. Reviewed importance of draining breast regularly to support lactation.  -Social determinants of health (SDOH) reviewed in EPIC. No concerns  3. Sexuality, contraception and birth spacing - Patient does not want a pregnancy in the next year.  Desired family size is 2 children.  - Reviewed reproductive life planning. Reviewed contraceptive methods based on pt preferences and effectiveness.  Patient desired Oral Contraceptive today.   - Discussed birth spacing of 18 months  4. Sleep and fatigue -Encouraged family/partner/community support of 4 hrs of uninterrupted sleep to help with mood and fatigue  5. Physical Recovery  - Discussed patients delivery and complications. She describes her labor as good. - Patient had a Vaginal, no problems at delivery. Patient had a 1st degree laceration. Perineal healing reviewed. Patient expressed understanding - Patient has urinary incontinence? No. - Patient is safe to resume physical and sexual activity  6.  Health Maintenance - HM due items addressed Yes - Last pap smear - unknown. Pap smear not done at today's visit. Will find most recent pap results and schedule as needed. -Breast Cancer screening indicated? No.   7. Chronic Disease/Pregnancy Condition follow up: None - PCP follow up  JGabriel Carina C[facility]. [facility]

## 2021-12-18 ENCOUNTER — Encounter: Payer: Self-pay | Admitting: Certified Nurse Midwife

## 2021-12-18 ENCOUNTER — Other Ambulatory Visit: Payer: Self-pay

## 2021-12-18 MED ORDER — NORETHINDRONE 0.35 MG PO TABS
1.0000 | ORAL_TABLET | Freq: Every day | ORAL | 11 refills | Status: AC
  Filled 2021-12-18 – 2022-04-24 (×2): qty 84, 84d supply, fill #0

## 2021-12-19 LAB — CERVICOVAGINAL ANCILLARY ONLY
Bacterial Vaginitis (gardnerella): NEGATIVE
Candida Glabrata: NEGATIVE
Candida Vaginitis: NEGATIVE
Chlamydia: NEGATIVE
Comment: NEGATIVE
Comment: NEGATIVE
Comment: NEGATIVE
Comment: NEGATIVE
Comment: NEGATIVE
Comment: NORMAL
Neisseria Gonorrhea: NEGATIVE
Trichomonas: NEGATIVE

## 2021-12-22 DIAGNOSIS — L988 Other specified disorders of the skin and subcutaneous tissue: Secondary | ICD-10-CM | POA: Diagnosis not present

## 2021-12-22 DIAGNOSIS — L608 Other nail disorders: Secondary | ICD-10-CM | POA: Diagnosis not present

## 2021-12-30 ENCOUNTER — Other Ambulatory Visit: Payer: Self-pay

## 2022-01-07 DIAGNOSIS — N644 Mastodynia: Secondary | ICD-10-CM | POA: Diagnosis not present

## 2022-01-07 DIAGNOSIS — D249 Benign neoplasm of unspecified breast: Secondary | ICD-10-CM | POA: Diagnosis not present

## 2022-01-07 DIAGNOSIS — D241 Benign neoplasm of right breast: Secondary | ICD-10-CM | POA: Diagnosis not present

## 2022-01-30 DIAGNOSIS — N898 Other specified noninflammatory disorders of vagina: Secondary | ICD-10-CM | POA: Diagnosis not present

## 2022-02-01 ENCOUNTER — Other Ambulatory Visit: Payer: Self-pay

## 2022-02-01 MED ORDER — METRONIDAZOLE 0.75 % VA GEL
VAGINAL | 0 refills | Status: DC
  Filled 2022-02-01: qty 70, 5d supply, fill #0

## 2022-02-03 ENCOUNTER — Other Ambulatory Visit: Payer: Self-pay

## 2022-02-04 ENCOUNTER — Ambulatory Visit: Payer: Commercial Managed Care - PPO

## 2022-02-25 ENCOUNTER — Encounter: Payer: Self-pay | Admitting: Certified Nurse Midwife

## 2022-03-04 ENCOUNTER — Other Ambulatory Visit: Payer: Self-pay

## 2022-03-04 ENCOUNTER — Ambulatory Visit (INDEPENDENT_AMBULATORY_CARE_PROVIDER_SITE_OTHER): Payer: 59 | Admitting: Certified Nurse Midwife

## 2022-03-04 ENCOUNTER — Encounter: Payer: Self-pay | Admitting: Certified Nurse Midwife

## 2022-03-04 ENCOUNTER — Other Ambulatory Visit (HOSPITAL_COMMUNITY)
Admission: RE | Admit: 2022-03-04 | Discharge: 2022-03-04 | Disposition: A | Discharge status: Home / Self Care | Payer: 59 | Source: Ambulatory Visit | Attending: Certified Nurse Midwife | Admitting: Certified Nurse Midwife

## 2022-03-04 VITALS — BP 116/72 | HR 86 | Wt 134.0 lb

## 2022-03-04 DIAGNOSIS — N941 Unspecified dyspareunia: Secondary | ICD-10-CM

## 2022-03-04 DIAGNOSIS — N898 Other specified noninflammatory disorders of vagina: Secondary | ICD-10-CM | POA: Insufficient documentation

## 2022-03-04 NOTE — Progress Notes (Signed)
History:  Laurie Rogers is a 32 y.o. [id number] who presents to clinic today for vaginal discomfort with discharge and dyspareunia. Had BV a couple of weeks ago (diagnosed by PCP and treated with flagyl). Symptoms (odor) improved but two days after completing treatment she noted increased thick white vaginal discharge with discomfort similar to itching/rawness. Also attempted intercourse and found it to be very uncomfortable just inside the vestibule. No other physical complaints.   The following portions of the patient's history were reviewed and updated as appropriate: allergies, current medications, family history, past medical history, social history, past surgical history and problem list.  Review of Systems:  Pertinent items noted in HPI and remainder of comprehensive ROS otherwise negative.   Objective:  Physical Exam BP 116/72   Pulse 86   Wt 134 lb (60.8 kg)   Breastfeeding Yes   BMI 26.17 kg/m  Physical Exam Vitals and nursing note reviewed.  Constitutional:      Appearance: Normal appearance.  HENT:     Head: Normocephalic and atraumatic.  Cardiovascular:     Rate and Rhythm: Normal rate and regular rhythm.  Pulmonary:     Effort: Pulmonary effort is normal.  Abdominal:     Palpations: Abdomen is soft.  Genitourinary:    Vagina: Vaginal discharge (thick white discharge noted on vaginal exam) present.    Musculoskeletal:        General: Normal range of motion.  Skin:    General: Skin is warm.     Capillary Refill: Capillary refill takes less than 2 seconds.  Neurological:     Mental Status: She is alert and oriented to person, place, and time.  Psychiatric:        Mood and Affect: Mood normal.        Behavior: Behavior normal.        Thought Content: Thought content normal.        Judgment: Judgment normal.    Labs and Imaging No results found for this or any previous visit (from the past 24 hour(s)).  No results found.   Assessment & Plan:  1.  Vaginal odor - Cervicovaginal ancillary only( [place])  2. Female dyspareunia - Suggested allowing likely yeast vaginitis to clear before attempting penetrative intercourse again. Advised slow entry with plenty of lubrication. - Put in referral to pelvic floor physical therapy to use if intercourse does not get better.  Follow up PRN or for annual exam.  Gabriel Carina, CNM 03/04/2022 8:39 PM

## 2022-03-05 ENCOUNTER — Encounter: Payer: Self-pay | Admitting: Certified Nurse Midwife

## 2022-03-05 ENCOUNTER — Telehealth: Payer: Self-pay | Admitting: Family Medicine

## 2022-03-05 ENCOUNTER — Other Ambulatory Visit: Payer: Self-pay

## 2022-03-05 LAB — CERVICOVAGINAL ANCILLARY ONLY
Bacterial Vaginitis (gardnerella): NEGATIVE
Candida Glabrata: NEGATIVE
Candida Vaginitis: NEGATIVE
Chlamydia: NEGATIVE
Comment: NEGATIVE
Comment: NEGATIVE
Comment: NEGATIVE
Comment: NEGATIVE
Comment: NEGATIVE
Comment: NORMAL
Neisseria Gonorrhea: NEGATIVE
Trichomonas: NEGATIVE

## 2022-03-05 MED ORDER — FLUCONAZOLE 150 MG PO TABS
150.0000 mg | ORAL_TABLET | Freq: Once | ORAL | 1 refills | Status: AC
  Filled 2022-03-05: qty 1, 1d supply, fill #0

## 2022-03-05 NOTE — Telephone Encounter (Signed)
Called by after hours RN Access Line  Patient was calling about needing a prescription sent. The patient had testing in the office yesterday but the results are not back. Provider documentation does not mention any medications sent.   I recommended the patient wait for her results to return and her provider to write her about possible prescriptions.   Vaginal testing can take 1-2 days to return and then the provider will address it.   Caren Macadam, MD

## 2022-03-05 NOTE — Addendum Note (Signed)
Addended by: Annabell Howells on: 03/05/2022 04:49 PM   Modules accepted: Orders

## 2022-03-10 ENCOUNTER — Other Ambulatory Visit: Payer: Self-pay

## 2022-03-12 DIAGNOSIS — Z3482 Encounter for supervision of other normal pregnancy, second trimester: Secondary | ICD-10-CM | POA: Diagnosis not present

## 2022-03-12 DIAGNOSIS — L608 Other nail disorders: Secondary | ICD-10-CM | POA: Diagnosis not present

## 2022-03-12 DIAGNOSIS — L821 Other seborrheic keratosis: Secondary | ICD-10-CM | POA: Diagnosis not present

## 2022-03-12 DIAGNOSIS — Z3483 Encounter for supervision of other normal pregnancy, third trimester: Secondary | ICD-10-CM | POA: Diagnosis not present

## 2022-03-25 ENCOUNTER — Encounter: Payer: Self-pay | Admitting: Physical Therapy

## 2022-03-25 ENCOUNTER — Ambulatory Visit: Payer: 59 | Attending: Certified Nurse Midwife | Admitting: Physical Therapy

## 2022-03-25 DIAGNOSIS — M62838 Other muscle spasm: Secondary | ICD-10-CM | POA: Diagnosis not present

## 2022-03-25 DIAGNOSIS — R293 Abnormal posture: Secondary | ICD-10-CM | POA: Insufficient documentation

## 2022-03-25 DIAGNOSIS — N941 Unspecified dyspareunia: Secondary | ICD-10-CM | POA: Insufficient documentation

## 2022-03-25 DIAGNOSIS — M6281 Muscle weakness (generalized): Secondary | ICD-10-CM | POA: Insufficient documentation

## 2022-03-25 NOTE — Therapy (Addendum)
OUTPATIENT PHYSICAL THERAPY FEMALE PELVIC EVALUATION   Patient Name: Laurie Rogers MRN: [id number] DOB:1990/09/30, 32 y.o., female Today's Date: 03/25/2022  END OF SESSION:  PT End of Session - 03/25/22 1008     Visit Number 1    Date for PT Re-Evaluation 06/17/22    Authorization Type [employer] employee    PT Start Time [phone]    PT Stop Time 1009    PT Time Calculation (min) 33 min    Activity Tolerance Patient tolerated treatment well    Behavior During Therapy [facility] for tasks assessed/performed             Past Medical History:  Diagnosis Date   Fibroadenoma    History of pre-eclampsia in prior pregnancy, currently pregnant 04/17/2021   Pre-eclampsia 2016   Past Surgical History:  Procedure Laterality Date   BREAST SURGERY     fibroadenoma removed   Patient Active Problem List   Diagnosis Date Noted   Fibroadenoma    H/O transfusion of packed red blood cells 04/25/2014   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  Breast feeding still currently. Labor and delivery was normal laboring 16 hours and no issues other than minor tear. Fluid intake: Yes: try to drink plenty of water    PAIN:  Are you having pain? No (only during intercourse) NPRS scale: 8-9/10 Pain location: Vaginal  Pain type: discomfort and stretching Pain description: intermittent   Aggravating  factors: intercourse Relieving factors:   PRECAUTIONS: None  WEIGHT BEARING RESTRICTIONS: No  FALLS:  Has patient fallen in last 6 months? No  LIVING ENVIRONMENT: Lives with: lives with their family, lives with their spouse, and 2 children Lives in: House/apartment   OCCUPATION: sitting work at home  PLOF: Independent  PATIENT GOALS: have pain free intercourse  PERTINENT HISTORY:  2 vaginal deliveries, one recent delivery, tearing Sexual abuse: No  BOWEL MOVEMENT: No issues  URINATION: No issues  INTERCOURSE: Pain with intercourse: Initial Penetration and During Penetration Ability to have vaginal penetration:  No Climax: NA Marinoff Scale: 3/3  PREGNANCY: Vaginal deliveries 2 Tearing Yes: 32 y/o 1 stitch, and recently 1 stitch   PROLAPSE: None   OBJECTIVE:   DIAGNOSTIC FINDINGS:    PATIENT SURVEYS:    PFIQ-7   COGNITION: Overall cognitive status: Within functional limits for tasks assessed     SENSATION: Light touch:  Proprioception:   MUSCLE LENGTH: Hamstrings: 70% bil Thomas test:   LUMBAR SPECIAL TESTS:  ASLR - heavy on left improved with pelvic compression  FUNCTIONAL TESTS:    GAIT:  Comments: WFL   POSTURE: increased lumbar lordosis and anterior pelvic tilt  PELVIC ALIGNMENT: left ilium anterior rotation  LUMBARAROM/PROM:  A/PROM A/PROM  eval  Flexion 75%  Extension   Right lateral flexion   Left lateral flexion   Right rotation   Left rotation    (Blank rows = not tested)  LOWER EXTREMITY ROM:  Passive ROM Right eval Left eval  Hip flexion    Hip extension    Hip abduction    Hip adduction    Hip internal rotation    Hip external rotation    Knee flexion    Knee extension    Ankle dorsiflexion    Ankle plantarflexion    Ankle inversion    Ankle eversion     (Blank rows = not tested)  LOWER EXTREMITY MMT:  MMT Right eval Left eval  Hip flexion    Hip extension    Hip abduction    Hip  adduction    Hip internal rotation    Hip external rotation    Knee flexion    Knee extension    Ankle dorsiflexion    Ankle plantarflexion    Ankle inversion    Ankle eversion     PALPATION:   General  lumbar and hamstrings tight                External Perineal Exam normal to slightly increased redness at introitus                             Internal Pelvic Floor TTP Rt levators, introitus - holds breath and inhales when doing kegel  Patient confirms identification and approves PT to assess internal pelvic floor and treatment Yes  PELVIC MMT:   MMT eval  Vaginal 3/5 1 rep, 2/5 for 4 more; can hold 5 sec then diminishing strength  Internal Anal Sphincter   External Anal Sphincter   Puborectalis   Diastasis Recti 1 finger 1 inch above and below umbilicus  (Blank rows = not tested)        TONE: high  PROLAPSE: Very slight anterior wall with valsalva  TODAY'S TREATMENT:                                                                                                                              DATE: 03/25/22  EVAL  and educated on initial HEP and self care for moisture and scar massage   PATIENT EDUCATION:  Education details: Access Code: [email or web address] Person educated: Patient Education method: Explanation, Demonstration, Tactile cues, Verbal cues, and Handouts Education comprehension: verbalized understanding and returned demonstration  HOME EXERCISE PROGRAM: Access Code: [email or web address] URL: [email or web address] Date: 03/25/2022 Prepared by: Dwana Curd  Exercises - Supine Diaphragmatic Breathing  - 3 x daily - 7 x weekly - 1 sets - 10 reps - Seated Hamstring Stretch  - 1 x daily - 7 x weekly - 1 sets - 3 reps - 30 sec hold  ASSESSMENT:  CLINICAL IMPRESSION: Patient is a 32 y.o. female who was seen today for physical therapy evaluation and treatment for dyspareunia. Pt has abdominal weakness with increased back and hamstring tension. Pt has  Diastasis rectus abdominus of one finger.  Pt is very tender to palpation at introitus and has tenderness and tension in bilateral levators Rt>Lt.  Pt has weakened pelvic MMT of 2-3/5 for 5 reps and then holding for 5 seconds before strength diminishes. Pt has difficulty isolating and coordinating pelvic floor and uses inhale and breath holding to perform kegel.  Pt will benefit from skilled PT to address impairments and restore function and be able to be intimate with partner.  OBJECTIVE IMPAIRMENTS: decreased coordination, decreased endurance, decreased ROM, decreased strength, increased fascial restrictions, increased muscle spasms, impaired flexibility, impaired tone, postural dysfunction, and pain.   ACTIVITY LIMITATIONS:  self care such as wellness exams limited due to pain  PARTICIPATION LIMITATIONS: interpersonal relationship  PERSONAL FACTORS: 1-2 comorbidities: 2 vaginal deliveries, one recent delivery, tearing  are also affecting patient's functional outcome.   REHAB POTENTIAL: Excellent  CLINICAL DECISION MAKING: Stable/uncomplicated  EVALUATION COMPLEXITY: Low   GOALS: Goals reviewed with patient? Yes  SHORT TERM GOALS: Target date: 04/22/22  Ind with scar massage and moisturizing to perineum Baseline: Goal status: INITIAL  2.  Ind with coordination of breathing and core activation Baseline:  Goal status: INITIAL   LONG TERM GOALS: Target date: 06/17/22  Pt will be independent with advanced HEP to maintain improvements made throughout therapy  Baseline:  Goal status: INITIAL  2.  Pt will have 0-1/3 score of Marinoff scale  Baseline:  Goal status: INITIAL  3.  Pt will report 80-100% reduction of pain due to improvements in posture, strength, and muscle length  Baseline:  Goal status: INITIAL  4.  Pt will have abdominal tension throughout core without any sign of diastasis rectus abdominus for improved stability during functional activities. Baseline:  Goal  status: INITIAL    PLAN:  PT FREQUENCY: 1x/week  PT DURATION: 12 weeks  PLANNED INTERVENTIONS: Therapeutic exercises, Therapeutic activity, Neuromuscular re-education, Balance training, Gait training, Patient/Family education, Self Care, Joint mobilization, Dry Needling, Electrical stimulation, Cryotherapy, Moist heat, Taping, Biofeedback, Manual therapy, and Re-evaluation  PLAN FOR NEXT SESSION: internal STM to vagnal canal to improve muscle length, lumbar STM release, core with coordination of tension on exhale   [employer], PT 03/25/2022, 10:25 AM   PHYSICAL THERAPY DISCHARGE SUMMARY  Visits from Start of Care: 1  Current functional level related to goals / functional outcomes: D/c after eval   Remaining deficits: See above   Education / Equipment: See above   Patient agrees to discharge. Patient goals were not met. Patient is being discharged due to  eval only.  Russella Dar, PT, DPT 07/20/22 10:14 AM

## 2022-03-27 DIAGNOSIS — Z0000 Encounter for general adult medical examination without abnormal findings: Secondary | ICD-10-CM | POA: Diagnosis not present

## 2022-03-27 DIAGNOSIS — Z1331 Encounter for screening for depression: Secondary | ICD-10-CM | POA: Diagnosis not present

## 2022-03-27 DIAGNOSIS — Z1330 Encounter for screening examination for mental health and behavioral disorders, unspecified: Secondary | ICD-10-CM | POA: Diagnosis not present

## 2022-03-27 DIAGNOSIS — N941 Unspecified dyspareunia: Secondary | ICD-10-CM | POA: Diagnosis not present

## 2022-03-27 DIAGNOSIS — M654 Radial styloid tenosynovitis [de Quervain]: Secondary | ICD-10-CM | POA: Diagnosis not present

## 2022-04-10 DIAGNOSIS — M654 Radial styloid tenosynovitis [de Quervain]: Secondary | ICD-10-CM | POA: Diagnosis not present

## 2022-04-14 DIAGNOSIS — N644 Mastodynia: Secondary | ICD-10-CM | POA: Diagnosis not present

## 2022-04-17 ENCOUNTER — Other Ambulatory Visit: Payer: Self-pay

## 2022-04-22 ENCOUNTER — Encounter: Payer: Self-pay | Admitting: Certified Nurse Midwife

## 2022-04-24 ENCOUNTER — Ambulatory Visit: Payer: 59 | Admitting: Physical Therapy

## 2022-04-24 ENCOUNTER — Other Ambulatory Visit: Payer: Self-pay

## 2022-05-06 ENCOUNTER — Ambulatory Visit: Payer: 59 | Admitting: Physical Therapy

## 2022-05-13 ENCOUNTER — Encounter: Payer: 59 | Admitting: Physical Therapy

## 2022-05-19 ENCOUNTER — Encounter: Payer: 59 | Admitting: Physical Therapy

## 2022-05-27 ENCOUNTER — Encounter: Payer: 59 | Admitting: Physical Therapy

## 2022-06-01 ENCOUNTER — Encounter: Payer: 59 | Admitting: Physical Therapy

## 2022-06-08 ENCOUNTER — Encounter: Payer: 59 | Admitting: Physical Therapy

## 2022-06-19 DIAGNOSIS — H938X1 Other specified disorders of right ear: Secondary | ICD-10-CM | POA: Diagnosis not present

## 2022-06-19 DIAGNOSIS — H6123 Impacted cerumen, bilateral: Secondary | ICD-10-CM | POA: Diagnosis not present

## 2022-07-14 DIAGNOSIS — M545 Low back pain, unspecified: Secondary | ICD-10-CM | POA: Diagnosis not present

## 2022-07-14 DIAGNOSIS — S134XXA Sprain of ligaments of cervical spine, initial encounter: Secondary | ICD-10-CM | POA: Diagnosis not present

## 2022-07-14 DIAGNOSIS — W108XXA Fall (on) (from) other stairs and steps, initial encounter: Secondary | ICD-10-CM | POA: Diagnosis not present

## 2022-07-14 DIAGNOSIS — S300XXA Contusion of lower back and pelvis, initial encounter: Secondary | ICD-10-CM | POA: Diagnosis not present

## 2022-07-14 DIAGNOSIS — M542 Cervicalgia: Secondary | ICD-10-CM | POA: Diagnosis not present

## 2022-08-14 DIAGNOSIS — M79675 Pain in left toe(s): Secondary | ICD-10-CM | POA: Diagnosis not present

## 2022-08-14 DIAGNOSIS — S93522A Sprain of metatarsophalangeal joint of left great toe, initial encounter: Secondary | ICD-10-CM | POA: Diagnosis not present

## 2022-08-14 DIAGNOSIS — M25775 Osteophyte, left foot: Secondary | ICD-10-CM | POA: Diagnosis not present

## 2022-08-14 DIAGNOSIS — W1849XA Other slipping, tripping and stumbling without falling, initial encounter: Secondary | ICD-10-CM | POA: Diagnosis not present

## 2022-08-14 DIAGNOSIS — M2012 Hallux valgus (acquired), left foot: Secondary | ICD-10-CM | POA: Diagnosis not present

## 2022-08-14 DIAGNOSIS — Y9301 Activity, walking, marching and hiking: Secondary | ICD-10-CM | POA: Diagnosis not present

## 2022-09-29 ENCOUNTER — Other Ambulatory Visit: Payer: Self-pay

## 2022-09-29 DIAGNOSIS — B351 Tinea unguium: Secondary | ICD-10-CM | POA: Diagnosis not present

## 2022-09-29 DIAGNOSIS — M79675 Pain in left toe(s): Secondary | ICD-10-CM | POA: Diagnosis not present

## 2022-09-29 DIAGNOSIS — L608 Other nail disorders: Secondary | ICD-10-CM | POA: Diagnosis not present

## 2022-09-29 MED ORDER — CICLOPIROX 0.77 % EX GEL
1.0000 | Freq: Two times a day (BID) | CUTANEOUS | 1 refills | Status: AC
  Filled 2022-09-29: qty 30, 90d supply, fill #0
  Filled 2023-08-31: qty 30, 30d supply, fill #0

## 2022-10-13 ENCOUNTER — Other Ambulatory Visit: Payer: Self-pay

## 2023-03-11 DIAGNOSIS — R1032 Left lower quadrant pain: Secondary | ICD-10-CM | POA: Diagnosis not present

## 2023-04-05 DIAGNOSIS — Z131 Encounter for screening for diabetes mellitus: Secondary | ICD-10-CM | POA: Diagnosis not present

## 2023-04-05 DIAGNOSIS — Z1330 Encounter for screening examination for mental health and behavioral disorders, unspecified: Secondary | ICD-10-CM | POA: Diagnosis not present

## 2023-04-05 DIAGNOSIS — R87610 Atypical squamous cells of undetermined significance on cytologic smear of cervix (ASC-US): Secondary | ICD-10-CM | POA: Diagnosis not present

## 2023-04-05 DIAGNOSIS — Z136 Encounter for screening for cardiovascular disorders: Secondary | ICD-10-CM | POA: Diagnosis not present

## 2023-04-05 DIAGNOSIS — Z0000 Encounter for general adult medical examination without abnormal findings: Secondary | ICD-10-CM | POA: Diagnosis not present

## 2023-04-05 DIAGNOSIS — Z1331 Encounter for screening for depression: Secondary | ICD-10-CM | POA: Diagnosis not present

## 2023-04-05 DIAGNOSIS — Z124 Encounter for screening for malignant neoplasm of cervix: Secondary | ICD-10-CM | POA: Diagnosis not present

## 2023-08-29 ENCOUNTER — Other Ambulatory Visit: Payer: Self-pay

## 2023-08-29 DIAGNOSIS — L739 Follicular disorder, unspecified: Secondary | ICD-10-CM | POA: Diagnosis not present

## 2023-08-29 MED ORDER — DOXYCYCLINE MONOHYDRATE 100 MG PO CAPS
100.0000 mg | ORAL_CAPSULE | Freq: Two times a day (BID) | ORAL | 0 refills | Status: AC
  Filled 2023-08-29: qty 14, 7d supply, fill #0

## 2023-08-29 MED ORDER — TRIAMCINOLONE ACETONIDE 0.025 % EX OINT
TOPICAL_OINTMENT | CUTANEOUS | 0 refills | Status: AC
  Filled 2023-08-29: qty 454, 14d supply, fill #0

## 2023-08-29 MED ORDER — CETIRIZINE HCL 10 MG PO TABS
10.0000 mg | ORAL_TABLET | Freq: Every day | ORAL | 0 refills | Status: AC
  Filled 2023-08-29: qty 30, 30d supply, fill #0

## 2023-08-30 ENCOUNTER — Other Ambulatory Visit: Payer: Self-pay

## 2023-08-31 ENCOUNTER — Other Ambulatory Visit: Payer: Self-pay

## 2023-09-03 DIAGNOSIS — R5383 Other fatigue: Secondary | ICD-10-CM | POA: Diagnosis not present

## 2023-09-03 DIAGNOSIS — L659 Nonscarring hair loss, unspecified: Secondary | ICD-10-CM | POA: Diagnosis not present

## 2023-09-06 DIAGNOSIS — L659 Nonscarring hair loss, unspecified: Secondary | ICD-10-CM | POA: Diagnosis not present

## 2023-09-06 DIAGNOSIS — R5383 Other fatigue: Secondary | ICD-10-CM | POA: Diagnosis not present

## 2023-09-08 ENCOUNTER — Other Ambulatory Visit: Payer: Self-pay

## 2023-09-08 DIAGNOSIS — L659 Nonscarring hair loss, unspecified: Secondary | ICD-10-CM | POA: Diagnosis not present

## 2023-09-08 DIAGNOSIS — E559 Vitamin D deficiency, unspecified: Secondary | ICD-10-CM | POA: Diagnosis not present

## 2023-09-08 DIAGNOSIS — R5382 Chronic fatigue, unspecified: Secondary | ICD-10-CM | POA: Diagnosis not present

## 2023-09-08 DIAGNOSIS — R7989 Other specified abnormal findings of blood chemistry: Secondary | ICD-10-CM | POA: Diagnosis not present

## 2023-09-08 MED ORDER — ERGOCALCIFEROL 1.25 MG (50000 UT) PO CAPS
50000.0000 [IU] | ORAL_CAPSULE | ORAL | 0 refills | Status: AC
  Filled 2023-09-08: qty 12, 84d supply, fill #0

## 2023-09-16 ENCOUNTER — Other Ambulatory Visit: Payer: Self-pay

## 2023-09-21 ENCOUNTER — Other Ambulatory Visit: Payer: Self-pay

## 2023-10-01 DIAGNOSIS — R7989 Other specified abnormal findings of blood chemistry: Secondary | ICD-10-CM | POA: Diagnosis not present

## 2023-10-17 DIAGNOSIS — E0590 Thyrotoxicosis, unspecified without thyrotoxic crisis or storm: Secondary | ICD-10-CM | POA: Diagnosis not present

## 2024-03-08 ENCOUNTER — Other Ambulatory Visit: Payer: Self-pay

## 2024-03-08 MED ORDER — FLUCONAZOLE 150 MG PO TABS
150.0000 mg | ORAL_TABLET | Freq: Once | ORAL | 0 refills | Status: AC
  Filled 2024-03-08: qty 2, 3d supply, fill #0

## 2024-03-08 MED ORDER — DOXYCYCLINE HYCLATE 100 MG PO TABS
100.0000 mg | ORAL_TABLET | Freq: Two times a day (BID) | ORAL | 0 refills | Status: AC
  Filled 2024-03-08: qty 14, 7d supply, fill #0

## 2024-03-10 ENCOUNTER — Other Ambulatory Visit: Payer: Self-pay
